# Patient Record
Sex: Female | Born: 1950
Health system: Southern US, Community
[De-identification: ages and names within clinical notes are randomized; demographics above are authoritative.]

## PROBLEM LIST (undated history)

## (undated) DIAGNOSIS — E785 Hyperlipidemia, unspecified: Secondary | ICD-10-CM

## (undated) DIAGNOSIS — K219 Gastro-esophageal reflux disease without esophagitis: Secondary | ICD-10-CM

## (undated) DIAGNOSIS — G7109 Other specified muscular dystrophies: Secondary | ICD-10-CM

## (undated) DIAGNOSIS — G7100 Muscular dystrophy, unspecified: Secondary | ICD-10-CM

## (undated) DIAGNOSIS — S42309A Unspecified fracture of shaft of humerus, unspecified arm, initial encounter for closed fracture: Secondary | ICD-10-CM

## (undated) DIAGNOSIS — E039 Hypothyroidism, unspecified: Secondary | ICD-10-CM

## (undated) HISTORY — PX: COLONOSCOPY: SHX174

## (undated) HISTORY — PX: BELPHAROPTOSIS REPAIR: SHX369

## (undated) HISTORY — DX: Gastro-esophageal reflux disease without esophagitis: K21.9

## (undated) HISTORY — DX: Muscular dystrophy, unspecified: G71.00

## (undated) HISTORY — DX: Other specified muscular dystrophies: G71.09

## (undated) HISTORY — DX: Hypothyroidism, unspecified: E03.9

## (undated) HISTORY — DX: Hyperlipidemia, unspecified: E78.5

## (undated) HISTORY — PX: OTHER SURGICAL HISTORY: SHX169

## (undated) HISTORY — DX: Unspecified fracture of shaft of humerus, unspecified arm, initial encounter for closed fracture: S42.309A

---

## 2001-09-11 ENCOUNTER — Ambulatory Visit (HOSPITAL_COMMUNITY): Admission: RE | Admit: 2001-09-11 | Discharge: 2001-09-11 | Payer: Self-pay | Admitting: Family Medicine

## 2001-09-11 ENCOUNTER — Encounter: Payer: Self-pay | Admitting: Family Medicine

## 2001-09-30 ENCOUNTER — Encounter: Payer: Self-pay | Admitting: Family Medicine

## 2001-09-30 ENCOUNTER — Ambulatory Visit (HOSPITAL_COMMUNITY): Admission: RE | Admit: 2001-09-30 | Discharge: 2001-09-30 | Payer: Self-pay | Admitting: Family Medicine

## 2002-04-28 ENCOUNTER — Ambulatory Visit (HOSPITAL_COMMUNITY): Admission: RE | Admit: 2002-04-28 | Discharge: 2002-04-28 | Payer: Self-pay | Admitting: Internal Medicine

## 2002-04-28 HISTORY — PX: OTHER SURGICAL HISTORY: SHX169

## 2002-06-15 ENCOUNTER — Ambulatory Visit (HOSPITAL_COMMUNITY): Admission: RE | Admit: 2002-06-15 | Discharge: 2002-06-15 | Payer: Self-pay | Admitting: Internal Medicine

## 2004-05-16 ENCOUNTER — Ambulatory Visit (HOSPITAL_COMMUNITY): Admission: RE | Admit: 2004-05-16 | Discharge: 2004-05-16 | Payer: Self-pay | Admitting: Family Medicine

## 2004-05-23 ENCOUNTER — Ambulatory Visit (HOSPITAL_COMMUNITY): Admission: RE | Admit: 2004-05-23 | Discharge: 2004-05-23 | Payer: Self-pay | Admitting: General Surgery

## 2004-05-24 ENCOUNTER — Ambulatory Visit (HOSPITAL_COMMUNITY): Admission: RE | Admit: 2004-05-24 | Discharge: 2004-05-24 | Payer: Self-pay | Admitting: Family Medicine

## 2005-06-11 ENCOUNTER — Ambulatory Visit (HOSPITAL_COMMUNITY): Admission: RE | Admit: 2005-06-11 | Discharge: 2005-06-11 | Payer: Self-pay | Admitting: Podiatry

## 2009-01-28 ENCOUNTER — Inpatient Hospital Stay (HOSPITAL_COMMUNITY): Admission: EM | Admit: 2009-01-28 | Discharge: 2009-01-31 | Payer: Self-pay | Admitting: Emergency Medicine

## 2009-01-29 ENCOUNTER — Ambulatory Visit: Payer: Self-pay | Admitting: Pulmonary Disease

## 2009-01-30 ENCOUNTER — Encounter: Payer: Self-pay | Admitting: Pulmonary Disease

## 2010-11-13 LAB — CBC
HCT: 38.8 % (ref 36.0–46.0)
HCT: 40 % (ref 36.0–46.0)
HCT: 41.1 % (ref 36.0–46.0)
Hemoglobin: 13.8 g/dL (ref 12.0–15.0)
Hemoglobin: 14.1 g/dL (ref 12.0–15.0)
Hemoglobin: 14.1 g/dL (ref 12.0–15.0)
MCHC: 35.3 g/dL (ref 30.0–36.0)
MCHC: 35.5 g/dL (ref 30.0–36.0)
MCHC: 34.4 g/dL (ref 30.0–36.0)
MCV: 89.8 fL (ref 78.0–100.0)
MCV: 90.2 fL (ref 78.0–100.0)
MCV: 92 fL (ref 78.0–100.0)
Platelets: 290 10*3/uL (ref 150–400)
Platelets: 292 10*3/uL (ref 150–400)
Platelets: 303 10*3/uL (ref 150–400)
RBC: 4.32 MIL/uL (ref 3.87–5.11)
RBC: 4.43 MIL/uL (ref 3.87–5.11)
RBC: 4.47 MIL/uL (ref 3.87–5.11)
RDW: 12.7 % (ref 11.5–15.5)
RDW: 12.7 % (ref 11.5–15.5)
RDW: 13 % (ref 11.5–15.5)
WBC: 10.4 10*3/uL (ref 4.0–10.5)
WBC: 6.7 10*3/uL (ref 4.0–10.5)
WBC: 17.4 10*3/uL — ABNORMAL HIGH (ref 4.0–10.5)

## 2010-11-13 LAB — BLOOD GAS, ARTERIAL
Acid-Base Excess: 1.1 mmol/L (ref 0.0–2.0)
Acid-base deficit: 4.2 mmol/L — ABNORMAL HIGH (ref 0.0–2.0)
Acid-base deficit: 2.8 mmol/L — ABNORMAL HIGH (ref 0.0–2.0)
Bicarbonate: 18.4 mEq/L — ABNORMAL LOW (ref 20.0–24.0)
Bicarbonate: 24.4 mEq/L — ABNORMAL HIGH (ref 20.0–24.0)
Bicarbonate: 20.5 mEq/L (ref 20.0–24.0)
Drawn by: 30575
FIO2: 0.35 %
FIO2: 1 %
FIO2: 0.32 %
MECHVT: 450 mL
MECHVT: 450 mL
O2 Saturation: 97.4 %
O2 Saturation: 99.8 %
O2 Saturation: 99 %
Patient temperature: 37
Patient temperature: 37
Patient temperature: 98.6
RATE: 12 resp/min
RATE: 12 resp/min
TCO2: 21.3 mmol/L (ref 0–100)
TCO2: 21.4 mmol/L (ref 0–100)
pCO2 arterial: 23.7 mmHg — ABNORMAL LOW (ref 35.0–45.0)
pCO2 arterial: 33.3 mmHg — ABNORMAL LOW (ref 35.0–45.0)
pCO2 arterial: 29.5 mmHg — ABNORMAL LOW (ref 35.0–45.0)
pH, Arterial: 7.478 — ABNORMAL HIGH (ref 7.350–7.400)
pH, Arterial: 7.503 — ABNORMAL HIGH (ref 7.350–7.400)
pH, Arterial: 7.456 — ABNORMAL HIGH (ref 7.350–7.400)
pO2, Arterial: 416 mmHg — ABNORMAL HIGH (ref 80.0–100.0)
pO2, Arterial: 86.6 mmHg (ref 80.0–100.0)
pO2, Arterial: 132 mmHg — ABNORMAL HIGH (ref 80.0–100.0)

## 2010-11-13 LAB — BASIC METABOLIC PANEL
BUN: 14 mg/dL (ref 6–23)
BUN: 15 mg/dL (ref 6–23)
BUN: 19 mg/dL (ref 6–23)
CO2: 21 mEq/L (ref 19–32)
CO2: 28 mEq/L (ref 19–32)
CO2: 22 mEq/L (ref 19–32)
Calcium: 9.2 mg/dL (ref 8.4–10.5)
Calcium: 9.6 mg/dL (ref 8.4–10.5)
Calcium: 9.3 mg/dL (ref 8.4–10.5)
Chloride: 105 mEq/L (ref 96–112)
Chloride: 106 mEq/L (ref 96–112)
Chloride: 111 mEq/L (ref 96–112)
Creatinine, Ser: 0.67 mg/dL (ref 0.4–1.2)
Creatinine, Ser: 0.87 mg/dL (ref 0.4–1.2)
Creatinine, Ser: 0.82 mg/dL (ref 0.4–1.2)
GFR calc Af Amer: >60 mL/min (ref >60)
GFR calc Af Amer: >60 mL/min (ref >60)
GFR calc Af Amer: >60 mL/min (ref >60)
GFR calc non Af Amer: >60 mL/min (ref >60)
GFR calc non Af Amer: >60 mL/min (ref >60)
GFR calc non Af Amer: >60 mL/min (ref >60)
Glucose, Bld: 102 mg/dL — ABNORMAL HIGH (ref 70–99)
Glucose, Bld: 178 mg/dL — ABNORMAL HIGH (ref 70–99)
Glucose, Bld: 139 mg/dL — ABNORMAL HIGH (ref 70–99)
Potassium: 3.6 mEq/L (ref 3.5–5.1)
Potassium: 3.8 mEq/L (ref 3.5–5.1)
Potassium: 3.7 mEq/L (ref 3.5–5.1)
Sodium: 137 mEq/L (ref 135–145)
Sodium: 138 mEq/L (ref 135–145)
Sodium: 141 mEq/L (ref 135–145)

## 2010-11-13 LAB — DIFFERENTIAL
Basophils Absolute: 0 10*3/uL (ref 0.0–0.1)
Basophils Absolute: 0 10*3/uL (ref 0.0–0.1)
Basophils Relative: 0 % (ref 0–1)
Basophils Relative: 1 % (ref 0–1)
Eosinophils Absolute: 0 10*3/uL (ref 0.0–0.7)
Eosinophils Absolute: 0.4 10*3/uL (ref 0.0–0.7)
Eosinophils Relative: 0 % (ref 0–5)
Eosinophils Relative: 5 % (ref 0–5)
Lymphocytes Relative: 46 % (ref 12–46)
Lymphocytes Relative: 9 % — ABNORMAL LOW (ref 12–46)
Lymphs Abs: 0.9 10*3/uL (ref 0.7–4.0)
Lymphs Abs: 3.1 10*3/uL (ref 0.7–4.0)
Monocytes Absolute: 0.1 10*3/uL (ref 0.1–1.0)
Monocytes Absolute: 0.4 10*3/uL (ref 0.1–1.0)
Monocytes Relative: 1 % — ABNORMAL LOW (ref 3–12)
Monocytes Relative: 7 % (ref 3–12)
Neutro Abs: 2.8 10*3/uL (ref 1.7–7.7)
Neutro Abs: 9.4 10*3/uL — ABNORMAL HIGH (ref 1.7–7.7)
Neutrophils Relative %: 42 % — ABNORMAL LOW (ref 43–77)
Neutrophils Relative %: 90 % — ABNORMAL HIGH (ref 43–77)

## 2010-11-13 LAB — GLUCOSE, CAPILLARY
Glucose-Capillary: 137 mg/dL — ABNORMAL HIGH (ref 70–99)
Glucose-Capillary: 140 mg/dL — ABNORMAL HIGH (ref 70–99)
Glucose-Capillary: 145 mg/dL — ABNORMAL HIGH (ref 70–99)
Glucose-Capillary: 146 mg/dL — ABNORMAL HIGH (ref 70–99)
Glucose-Capillary: 152 mg/dL — ABNORMAL HIGH (ref 70–99)
Glucose-Capillary: 155 mg/dL — ABNORMAL HIGH (ref 70–99)
Glucose-Capillary: 157 mg/dL — ABNORMAL HIGH (ref 70–99)
Glucose-Capillary: 158 mg/dL — ABNORMAL HIGH (ref 70–99)
Glucose-Capillary: 104 mg/dL — ABNORMAL HIGH (ref 70–99)
Glucose-Capillary: 108 mg/dL — ABNORMAL HIGH (ref 70–99)
Glucose-Capillary: 122 mg/dL — ABNORMAL HIGH (ref 70–99)
Glucose-Capillary: 131 mg/dL — ABNORMAL HIGH (ref 70–99)
Glucose-Capillary: 159 mg/dL — ABNORMAL HIGH (ref 70–99)
Glucose-Capillary: 207 mg/dL — ABNORMAL HIGH (ref 70–99)

## 2010-11-13 LAB — CHOLESTEROL, TOTAL: Cholesterol: 178 mg/dL (ref 0–200)

## 2010-11-13 LAB — TRIGLYCERIDES: Triglycerides: 337 mg/dL — ABNORMAL HIGH (ref <150)
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
## 2010-12-19 NOTE — Discharge Summary (Signed)
[name], [name]               ACCOUNT NO.:  [id number]   MEDICAL RECORD NO.:  [id number]          PATIENT TYPE:  INP   LOCATION:  5522                         FACILITY:  [facility]   PHYSICIAN:  Casimiro Needle B. Sherene Sires, MD, FCCPDATE OF BIRTH:  [birth year]   DATE OF ADMISSION:  01/29/2009  DATE OF DISCHARGE:  01/31/2009                               DISCHARGE SUMMARY   DISCHARGE DIAGNOSES:  1. Acute respiratory failure, secondary to angioedema.  2. Anaphylaxis.  3. Hypothyroidism.  4. Hyperglycemia.   HISTORY OF PRESENT ILLNESS:  [name] is a 60 year old female who  recently had bunion surgery on her right foot and attempted to take some  pain medication, which was less powerful than her Percocet and took  Motrin the morning of June 25, after which she acutely developed tongue  and airway swelling.  She was intubated at [facility] and  transferred the evening of June 26, to [facility] for the  purpose of ENT evaluation regarding extubation given her angioedema.  Critical Care Medicine was asked to admit the patient from [facility] on June 26 and she was admitted directly to [facility]  intubated from [facility].   LABORATORY DATA:  Most recent laboratory data, basic metabolic panel,  sodium 141, potassium 3.7, glucose 139, BUN 19, and creatinine 0.82.  CBC, white blood cell 17.4, hemoglobin 14.1, hematocrit  41.1, platelets  303.  Arterial blood gas from June 26, pH 7.45, PCO2 29.5, PO2 132, and  bicarb 20.5.   RADIOLOGY DATA:  Most recent chest x-ray from June 27, shows decreased  left basilar atelectasis and stable chronic bronchitic changes.   MICRO DATA:  No micro data is available at this time.   HOSPITAL COURSE BY DISCHARGE DIAGNOSES:  1. Acute respiratory failure secondary to angioedema.  The patient      developed respiratory failure secondary to angioedema from the use      of NSAIDs.  She was intubated at [facility] on June  25,      transferred to [facility] on June 26, for ENT evaluation      of extubation.  The patient was intubated from June 25 though June      27.  She was successfully extubated on the 27th.  She has had no      further airway issues.  She was treated with steroids with Pepcid      and Benadryl.  The patient actually self-extubated on June 27 prior      to evaluation by ENT, but was stable and has tolerated extubation.      She is now wake and alert.  No airway problems.  No wheezing or      stridor.  Pepcid and Benadryl will be discontinued and the patient      will continued on prednisone taper.  She also has an allergy to      Prilosec and to some mouth care agents.  She has been recommended      to only use sodium bicarb based solution for mouth care  and she      will completely avoid all NSAIDs.  2. Anaphylaxis.  Again, this was due to an obvious NSAID allergy.  The      patient has been instructed to use no NSAIDs and has been      instructed not to use Prilosec or any mouth care agents other than      sodium bicarbonate based solutions for mouth care as she has an      allergy to these as well.  Anaphylaxis is completely resolved.  The      patient's vital signs are stable.  Airway is no longer an issue.      She is awake and alert.  She will follow up with her primary care      physician.  3. Hypothyroidism.  The patient was continued on her home dose of      Synthroid.  This is an ongoing problem.  She will follow up with      her regular physician.  4. Hyperglycemia.  The patient was most likely hyperglycemic due to      steroids.  Steroids are currently being tapered.  She was treated      with sliding scale insulin while she was an inpatient.  Blood      sugars are trending back to normal.  The last blood sugar was 139.      Again, she will followup with her primary care physician, but this      should resolve as she tapers off her prednisone.   DISCHARGE  MEDICATIONS:  1. Crestor 40 mg p.o. daily.  2. Cymbalta 60 mg p.o. daily.  3. Levoxyl 88 mcg p.o. daily.  4. Nexium 40 mg p.o. daily.  5. Percocet 5/325 one tablet p.o. q.4 h. as needed.  6. Prednisone 10 mg, this will be 3 tablets p.o. daily x2 days, and 2      tablets p.o. daily x2 days, then 1 tablet p.o. daily x2 days, then      stop.  This is a new medication for the patient and a prescription      was given.  7. Benadryl 25 mg p.o. q.12 h. as needed.   FOLLOWUP:  The patient is to follow up with her primary care physician,  Dr. Phillips Odor.  The patient is also to follow up with ENT who she  apparently has a previous relationship with at [facility].  She is to make  both of these appointments on her own in the next 1 to 2 weeks.  She is  to call our office sooner as needed.   DISPOSITION:  The patient has met maximum benefits from her inpatient  hospitalization.  Her respiratory failure and angioedema have resolved  and was clearly related to anaphylactic reaction to NSAID.  She has been  instructed not to take any NSAIDs of any kind.  The patient will follow  up on an outpatient basis with her primary care physician, Dr. Phillips Odor.  She will be discharged on her regular home medications with the addition  of prednisone taper.  She can take Benadryl as needed.      Dirk Dress, NP      Charlaine Dalton. Sherene Sires, MD, [facility]  Electronically Signed    KW/MEDQ  D:  01/31/2009  T:  01/31/2009  Job:  [id number]   cc:   Charlaine Dalton. Sherene Sires, MD, FCCP  Corrie Mckusick, M.D.
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
## 2010-12-22 NOTE — Op Note (Signed)
   NAME:  [name], [name]                         ACCOUNT NO.:  [id number]   MEDICAL RECORD NO.:  [id number]                   PATIENT TYPE:  AMB   LOCATION:  DAY                                  FACILITY:  [facility]   PHYSICIAN:  Gerrit Friends. Rourk, M.D.               DATE OF BIRTH:  26-Jun-1951   DATE OF PROCEDURE:  04/29/2002  DATE OF DISCHARGE:  04/28/2002                                 OPERATIVE REPORT   PROCEDURE:  Ambulatory esophageal pH report.   INDICATIONS:  Please see esophageal manometry report.   PRIOR STUDIES:  Please see esophageal manometry report.   METHOD:  The pH electrodes were placed 20 and 5 cm above the proximal border  of the manometrically determined lower esophageal sphincter (LES).  These  electrodes were defined as channels 1 and 2, respectively.  The data was  converted using the [employer], version 2.30. A reflux  episode was defined as a drop in pH below 4.0.  The procedure was reviewed  with the patient prior to performing it.   FINDINGS:  Proximal border of the LES (location from nares): 42 cm.  Study duration: 24 hours.  Channel 2 analysis  Total time pH below 4.0 (min): 14 minutes.  Fraction of total time pH below 4.0: 1.0.  DeMeester score (DeMeester normals less than 14.7 (95th percentile): 5.3.   IMPRESSION:  This study was performed on proton pump inhibitor therapy.  The  patient reported taking 1 dose daily; however, according to noted provided  by Dr. Lucky Cowboy she was on b.i.d. therapy.  Based on this 24-hour study  there was physiological reflux.  She appears to have adequate control of  acid reflux on PTI therapy.  However, there is no baseline pH study for  comparison.  Please note, the patient did not report any symptoms during  this 24-hour period study.  Therefore, correlation with symptoms is  unavailable.   Based on EM and pH study, it is difficult to correlate patient's symptoms  with these findings.  However,  please note if patient had significant reflux  esophagitis with laryngopharyngeal reflux it could take high dose PPI  therapy for several months to have complete resolution of symptoms.  If  patient's symptoms persist should consider modified barium swallow study,  and/or EGD.  AM and pH findings reviewed and discussed with Dr. Jena Gauss.     Tana Coast, P.A.                        Gerrit Friends. Rourk, M.D.    LL/MEDQ  D:  05/04/2002  T:  05/05/2002  Job:  [id number]   cc:   Lucky Cowboy, MD

## 2010-12-22 NOTE — Op Note (Signed)
NAMETIFFIANY, BEADLES               ACCOUNT NO.:  [id number]   MEDICAL RECORD NO.:  [id number]           PATIENT TYPE:  AMB   LOCATION:                                FACILITY:  [facility]   PHYSICIAN:  Oley Balm. Pricilla Holm, D.P.M.DATE OF BIRTH:  Oct 21, 1950   DATE OF PROCEDURE:  06/11/2005  DATE OF DISCHARGE:                                 OPERATIVE REPORT   SURGEON:  Oley Balm. Pricilla Holm, D.P.M.   ANESTHESIA:  Monitored anesthesia care.   PREOPERATIVE DIAGNOSES:  1.  Hallux valgus deformity, left foot.  2.  Hyperostosis, hallux.   POSTOPERATIVE DIAGNOSES:  1.  Hallux valgus deformity, left foot.  2.  Hyperostosis, hallux.   OPERATION/PROCEDURE:  Austin bunionectomy, left foot with arthroplasty,  hallux.   INDICATIONS:  Long-standing history of pain unrelieved by conservative care.   DESCRIPTION OF PROCEDURE:  The patient was brought to the operating room and  placed on the operating table in the supine position.  The patient's lower  left foot and leg were then prepped and draped in the usual aseptic manner.  Then with an ankle tourniquet in place, and well padded to prevent contusion  and elevated to 250 mmHg, after exsanguination of the left foot, the  following surgical procedure was then performed under monitored anesthesia  care.   Austin bunionectomy, left foot, with arthroplasty left hallux, double K-wire  fixation:  Attention was directed towards the medial aspect of the first MTP  where a curvilinear incision was made and incision widened and deepened  using sharp and blunt dissection, being sure to identify and retract all  vital structures.  Capsular incision was then made and the head of the  metatarsal freed from surrounding soft tissue attachments dorsally and  medially.  Utilizing a Zimmer oscillating saw, the medial aspect of the  first metatarsal head was resected.  Utilizing a guide wire, an Austin-type  osteotomy was made with the apex distal and the base proximal.  A  lateral  fibular sesamoid release was performed and the capsule fragment was then  displaced laterally, impacted on the first metatarsal shaft, and fixated  with two 0.045 K-wires.  After fixation, it was noted that the osteotomy  site was stable.  The remaining protruding aspect of the metatarsal head was  rasped smooth and resected.  Then a capsulorrhaphy was performed, basically  taking a V-wedge capsule out of the medial capsule to keep the toe in rectus  position.  The capsule was then approximated utilizing continuous running  sutures of 3-0 Dexon.  Skin was approximated utilizing running subcuticular  suture of 4-0 Dexon.   All surgical sites were then infiltrated with approximately 1/8 mL of  dexamethasone phosphate and Mild compressive bandage consisting of Betadine-  soaked Adaptics and 4 x 4 sterile Kling was applied.  The patient tolerated  the procedure well and left the operating room in apparent good condition  with good vital signs to the recovery room.      Oley Balm Pricilla Holm, D.P.M.  Electronically Signed     DBT/MEDQ  D:  06/11/2005  T:  06/11/2005  Job:  [id number]

## 2010-12-22 NOTE — Op Note (Signed)
NAME:  [name], [name]                         ACCOUNT NO.:  [id number]   MEDICAL RECORD NO.:  [id number]                   PATIENT TYPE:  AMB   LOCATION:  DAY                                  FACILITY:  [facility]   PHYSICIAN:  Gerrit Friends. Rourk, M.D.               DATE OF BIRTH:   DATE OF PROCEDURE:  04/28/2002  DATE OF DISCHARGE:  04/28/2002                                 OPERATIVE REPORT   PROCEDURE:  Esophageal mammometer.   INDICATIONS:  Dysphagia with laryngopharyngeal reflux.   PRIOR STUDIES:  04/13/02 transnasal fiberoptic laryngoscopy by Dr. Lucky Cowboy revealed a moderate amount of vocal cord edema with blunting of  ventricles and moderate interarytenoid edema.  02/03 barium pill esophagogram revealed normal appearance of the cervical  and thoracic esophagus, mild selective pooling, laryngeal penetration of a  significant amount of contrast during swallowing a portion of which  spontaneously cleared and a portion of which traveled dependently through  the false cords.  Questionable filling defect left lateral aspect of the  pharynx at the level of the mid and superior portions of the left pyriform  sinus.   METHOD:  The esophageal manometry study was performed with [employer]ics Medial  Gastrosoft Upper Gastrointestinal Polygram, version 6.40, using an Arndorfer  infusion system at 0.5 cc/min.  The lower esophageal sphincter (LES) was  identified in four ports with the standard station pull through technique.  For esophageal body pressures, the tip of the catheter was placed 3 cm above  the proximal aspect of the LES.  Ten wet swallows were taken with the ports  5 cm apart in the esophageal body.  This procedure was reviewed with the  patient prior to performing it.   FINDINGS:  Esophageal body:         13 cm       8 cm        3 cm        Mean  (distal 2 ports)  Amplitude (mmHg)  (38-102)    (49-131)    (64-154)    (59-139)  Patient                 11.1        32.4        45.7         39  Duration (seconds)            (3-5)       (3-5)       (2.5-3.5)   (3.5)  Patient                 4.3         4.0         4.8         4.4  Contractions (with wet swallows) 70% low amplitude, 10% simultaneous.  Otherwise good peristalsis seen.  Lower esophageal sphincter (LES): Located  at 45-42 cm (from nares)  Resting pressure: 6.1 mmHg.  Relaxation:  Adequate.   IMPRESSION:  1. Nonspecific motility disorder.  There was low amplitude seen in 70% of     the swallows.  There was also low resting pressure with adequate LES     relaxation.  This could be seen in reflux esophagitis.  The findings do     not necessarily explain the patient's symptoms; however.  2. It is unclear with baseline information provided how long the patient has     been on proton pump inhibitor.  Low     amplitude and low resting pressures can be seen in reflux esophagitis;     however, it remains unclear how long it takes for the above findings to     reverse after adequate control of acid reflux.   RECOMMENDATIONS:  Please see esophageal pH probe study report.     Tana Coast, P.A.                        Gerrit Friends. Rourk, M.D.    LL/MEDQ  D:  05/04/2002  T:  05/04/2002  Job:  [id number]   cc:   Dorna Leitz, M.D.
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
## 2010-12-22 NOTE — H&P (Signed)
[name], CARLUCCI NO.:  [id number]   MEDICAL RECORD NO.:  [id number]          PATIENT TYPE:  OUT   LOCATION:  RAD                           FACILITY:  [facility]   PHYSICIAN:  Dalia Heading, M.D.  DATE OF BIRTH:  08-Aug-1950   DATE OF ADMISSION:  05/16/2004  DATE OF DISCHARGE:  10/11/2005LH                                HISTORY & PHYSICAL   CHIEF COMPLAINT:  Need for screening colonoscopy.   HISTORY OF PRESENT ILLNESS:  The patient is a 60 year old white female who  is referred for endoscopic evaluation.  She needs a colonoscopy for  screening purposes.  No abdominal pain, weight loss, nausea, vomiting,  diarrhea, constipation, melena, or hematochezia have been noted.  There is  no history, has never had a colonoscopy.   PAST MEDICAL HISTORY:  Hypothyroidism.   PAST SURGICAL HISTORY:  Unremarkable.   CURRENT MEDICATIONS:  Levoxyl, Vytorin.   ALLERGIES:  No known drug allergies.   REVIEW OF SYSTEMS:  The patient gives a history of muscular dysfunction  secondary to muscular dystrophy.  This primarily affects her neck.  No other  abnormalities are noted.   PHYSICAL EXAMINATION:  GENERAL:  The patient is a well-developed, well-  nourished white female in no acute distress.  VITAL SIGNS:  She is afebrile and vital signs are stable.  CHEST:  Lungs clear to auscultation with equal breath sounds bilaterally.  CARDIAC:  Regular rate and rhythm without S3, S4, or murmurs.  ABDOMEN:  Soft, nontender, nondistended.  No hepatosplenomegaly or masses  are noted.  RECTAL:  Deferred to the procedure.   IMPRESSION:  Need for screening colonoscopy.   PLAN:  The patient is scheduled for a colonoscopy on May 23, 2004.  The  risks and benefits of the procedure, including bleeding and perforation,  were fully explained to the patient, who gave informed consent.     Mark   MAJ/MEDQ  D:  05/18/2004  T:  05/18/2004  Job:  [id number]   cc:   Corrie Mckusick, M.D.  Fax:  [phone]   [facility]  Fax: [phone]

## 2010-12-22 NOTE — Op Note (Signed)
   NAME:  [name], [name]                         ACCOUNT NO.:  [id number]   MEDICAL RECORD NO.:  [id number]                   PATIENT TYPE:  AMB   LOCATION:  DAY                                  FACILITY:  [facility]   PHYSICIAN:  R. Roetta Sessions, M.D.              DATE OF BIRTH:  [birth year]   DATE OF PROCEDURE:  06/15/2002  DATE OF DISCHARGE:  06/15/2002                                 OPERATIVE REPORT   ADDENDUM:   PROCEDURE:  Esophageal manometry.   INDICATION FOR PROCEDURE:  The patient is a very pleasant lady referred over  by Lucky Cowboy, M.D., for evaluation of difficulty initiating swallowing.  She underwent esophageal manometry on 04/28/02, which revealed a nonspecific  esophageal motility disorder.  The UES was nonspecifically studied.   An ambulatory esophageal pH probe study revealed equivocal findings.   I was asked by Dr. Gerilyn Pilgrim to bring this patient back and study her UES.  This  was done at no charge to the patient.  This procedure was discussed with the  patient.   METHOD:  Please note the manometry catheter was placed through the nares,  and the LES was determined.  The proximal port was pulled back up to the  level of the UES, and wet swallows were performed.  UES function was noted  to be repeatedly normal with residual pressure (pressure had been negative  with relaxation) to negative territory.  The tracings were reviewed with  myself and Lionel December, M.D.   ASSESSMENT:  Upper esophageal sphincter relaxation within normal limits.  Unable to make a diagnosis of upper esophageal sphincter achalasia.    DISCUSSION:  Although the patient's symptoms are compelling manometrically,  we cannot make a diagnosis.  I have discussed the findings with Dr. Gerilyn Pilgrim.  She tells me she will likely send the patient to [facility] for further evaluation.                                                Jonathon Bellows, M.D.    RMR/MEDQ  D:  07/19/2002   T:  07/20/2002  Job:  [id number]   cc:   Lucky Cowboy, M.D.  [address] [place]  [place] [address]  Fax: [phone]   Mila Homer. Sudie Bailey, M.D.  [address] [place], [place] [address]  Fax: [phone]

## 2011-09-17 DIAGNOSIS — G7109 Other specified muscular dystrophies: Secondary | ICD-10-CM | POA: Insufficient documentation

## 2012-04-11 ENCOUNTER — Other Ambulatory Visit (HOSPITAL_COMMUNITY): Payer: Self-pay | Admitting: Family Medicine

## 2012-04-11 DIAGNOSIS — E039 Hypothyroidism, unspecified: Secondary | ICD-10-CM | POA: Diagnosis not present

## 2012-04-11 DIAGNOSIS — E785 Hyperlipidemia, unspecified: Secondary | ICD-10-CM | POA: Diagnosis not present

## 2012-04-11 DIAGNOSIS — Z0000 Encounter for general adult medical examination without abnormal findings: Secondary | ICD-10-CM | POA: Diagnosis not present

## 2012-04-11 DIAGNOSIS — IMO0002 Reserved for concepts with insufficient information to code with codable children: Secondary | ICD-10-CM | POA: Diagnosis not present

## 2012-04-11 DIAGNOSIS — Z01419 Encounter for gynecological examination (general) (routine) without abnormal findings: Secondary | ICD-10-CM

## 2012-04-16 ENCOUNTER — Ambulatory Visit (HOSPITAL_COMMUNITY)
Admission: RE | Admit: 2012-04-16 | Discharge: 2012-04-16 | Disposition: A | Discharge status: Home / Self Care | Payer: Medicare Other | Source: Ambulatory Visit | Attending: Family Medicine | Admitting: Family Medicine

## 2012-04-16 DIAGNOSIS — M949 Disorder of cartilage, unspecified: Secondary | ICD-10-CM | POA: Insufficient documentation

## 2012-04-16 DIAGNOSIS — Z13820 Encounter for screening for osteoporosis: Secondary | ICD-10-CM | POA: Diagnosis not present

## 2012-04-16 DIAGNOSIS — M899 Disorder of bone, unspecified: Secondary | ICD-10-CM | POA: Insufficient documentation

## 2012-04-16 DIAGNOSIS — Z780 Asymptomatic menopausal state: Secondary | ICD-10-CM | POA: Diagnosis not present

## 2012-04-16 DIAGNOSIS — Z01419 Encounter for gynecological examination (general) (routine) without abnormal findings: Secondary | ICD-10-CM

## 2012-05-12 ENCOUNTER — Telehealth: Payer: Self-pay

## 2012-05-12 NOTE — Telephone Encounter (Signed)
LMOM to call.

## 2012-05-16 ENCOUNTER — Telehealth: Payer: Self-pay | Admitting: Internal Medicine

## 2012-05-16 NOTE — Telephone Encounter (Signed)
Pt was calling to be triaged for a tcs. Please call her back on Monday at [phone]

## 2012-05-19 ENCOUNTER — Telehealth: Payer: Self-pay

## 2012-05-19 NOTE — Telephone Encounter (Signed)
See separate phone note.

## 2012-05-19 NOTE — Telephone Encounter (Signed)
Called pt to triage for colonoscopy. She has muscular dystrophy and sometimes difficult swallowing. Ov scheduled for 06/23/2012 @ 8:30 AM with Gerrit Halls, NP. ( Pt requested a nov appt) Sending a letter to PCP.

## 2012-05-19 NOTE — Telephone Encounter (Signed)
See separate triage note.  

## 2012-06-20 ENCOUNTER — Encounter: Payer: Self-pay | Admitting: Internal Medicine

## 2012-06-23 ENCOUNTER — Encounter: Payer: Self-pay | Admitting: Gastroenterology

## 2012-06-23 ENCOUNTER — Ambulatory Visit (INDEPENDENT_AMBULATORY_CARE_PROVIDER_SITE_OTHER): Payer: Medicare Other | Admitting: Gastroenterology

## 2012-06-23 VITALS — BP 110/68 | HR 60 | Temp 97.4°F | Ht 62.0 in | Wt 115.2 lb

## 2012-06-23 DIAGNOSIS — R1310 Dysphagia, unspecified: Secondary | ICD-10-CM

## 2012-06-23 DIAGNOSIS — Z1211 Encounter for screening for malignant neoplasm of colon: Secondary | ICD-10-CM | POA: Diagnosis not present

## 2012-06-23 MED ORDER — PEG 3350-KCL-NA BICARB-NACL 420 G PO SOLR: 4000.0000 mL | ORAL | Status: DC

## 2012-06-23 NOTE — Patient Instructions (Addendum)
We have set you up for a colonoscopy with Dr. Jena Gauss in the near future.  Have a wonderful Thanksgiving!
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
## 2012-06-24 ENCOUNTER — Encounter: Payer: Self-pay | Admitting: Gastroenterology

## 2012-06-24 DIAGNOSIS — R1310 Dysphagia, unspecified: Secondary | ICD-10-CM | POA: Insufficient documentation

## 2012-06-24 DIAGNOSIS — Z1211 Encounter for screening for malignant neoplasm of colon: Secondary | ICD-10-CM | POA: Insufficient documentation

## 2012-06-24 NOTE — Progress Notes (Signed)
Faxed to PCP

## 2012-06-24 NOTE — Assessment & Plan Note (Signed)
Likely secondary to oculopharyngeal muscular dystrophy. Pt does not desire upper GI evaluation. Appetite good, wt stable. No other concerning signs or symptoms. Pt notes good control of reflux with dietary modification and Nexium. Retrieve op notes from [place] regarding surgery performed on muscle in throat. (for our review).

## 2012-06-24 NOTE — Assessment & Plan Note (Signed)
61 year old female with last TCS in remote past; pt is unsure if performed by [place] or elsewhere. I have attempted to retrieve records if available from [facility]. Regardless, she is due for routine screening colonoscopy. She has no lower GI symptoms currently.   Proceed with TCS with Dr. Jena Gauss in near future: the risks, benefits, and alternatives have been discussed with the patient in detail. The patient states understanding and desires to proceed.

## 2012-06-26 ENCOUNTER — Encounter (HOSPITAL_COMMUNITY): Payer: Self-pay | Admitting: Gastroenterology

## 2012-06-26 NOTE — Progress Notes (Signed)
I have cancelled procedure and I have LMOM for patient to return my call to let her know the situation

## 2012-06-26 NOTE — Progress Notes (Signed)
Received reports from [facility].  Pt actually had a colonoscopy in Oct 2005 with Dr. Lovell Sheehan. This was normal.  No FH of colon cancer. I don't believe there is a hx of polyps. She does not need a colonoscopy until 2015. She is not having any problems currently.  Please let pt know, and she can return to see [place] in 2015 or sooner if needed. Can cancel colonoscopy upcoming with Dr. Jena Gauss.

## 2012-07-11 ENCOUNTER — Encounter (HOSPITAL_COMMUNITY): Admission: RE | Payer: Self-pay | Source: Ambulatory Visit

## 2012-07-11 ENCOUNTER — Ambulatory Visit (HOSPITAL_COMMUNITY): Admission: RE | Admit: 2012-07-11 | Payer: Medicare Other | Source: Ambulatory Visit | Admitting: Internal Medicine

## 2012-07-11 SURGERY — COLONOSCOPY
Anesthesia: Moderate Sedation

## 2013-07-29 DIAGNOSIS — E039 Hypothyroidism, unspecified: Secondary | ICD-10-CM | POA: Diagnosis not present

## 2013-07-29 DIAGNOSIS — IMO0002 Reserved for concepts with insufficient information to code with codable children: Secondary | ICD-10-CM | POA: Diagnosis not present

## 2013-07-29 DIAGNOSIS — E785 Hyperlipidemia, unspecified: Secondary | ICD-10-CM | POA: Diagnosis not present

## 2014-04-06 ENCOUNTER — Other Ambulatory Visit (HOSPITAL_COMMUNITY): Payer: Self-pay | Admitting: Physician Assistant

## 2014-04-06 DIAGNOSIS — Z124 Encounter for screening for malignant neoplasm of cervix: Secondary | ICD-10-CM | POA: Diagnosis not present

## 2014-04-06 DIAGNOSIS — Z0000 Encounter for general adult medical examination without abnormal findings: Secondary | ICD-10-CM | POA: Diagnosis not present

## 2014-04-06 DIAGNOSIS — N879 Dysplasia of cervix uteri, unspecified: Secondary | ICD-10-CM | POA: Diagnosis not present

## 2014-04-06 DIAGNOSIS — IMO0002 Reserved for concepts with insufficient information to code with codable children: Secondary | ICD-10-CM | POA: Diagnosis not present

## 2014-04-06 DIAGNOSIS — Z01419 Encounter for gynecological examination (general) (routine) without abnormal findings: Secondary | ICD-10-CM | POA: Diagnosis not present

## 2014-04-06 DIAGNOSIS — Z79899 Other long term (current) drug therapy: Secondary | ICD-10-CM | POA: Diagnosis not present

## 2014-04-06 DIAGNOSIS — Z1231 Encounter for screening mammogram for malignant neoplasm of breast: Secondary | ICD-10-CM

## 2014-04-07 ENCOUNTER — Other Ambulatory Visit (HOSPITAL_COMMUNITY): Payer: Self-pay | Admitting: Physician Assistant

## 2014-04-07 DIAGNOSIS — N879 Dysplasia of cervix uteri, unspecified: Secondary | ICD-10-CM

## 2014-04-09 ENCOUNTER — Ambulatory Visit (HOSPITAL_COMMUNITY)
Admission: RE | Admit: 2014-04-09 | Discharge: 2014-04-09 | Disposition: A | Discharge status: Home / Self Care | Payer: Medicare Other | Source: Ambulatory Visit | Attending: Physician Assistant | Admitting: Physician Assistant

## 2014-04-09 DIAGNOSIS — N879 Dysplasia of cervix uteri, unspecified: Secondary | ICD-10-CM | POA: Insufficient documentation

## 2014-04-09 DIAGNOSIS — Z1231 Encounter for screening mammogram for malignant neoplasm of breast: Secondary | ICD-10-CM

## 2014-04-09 DIAGNOSIS — N83209 Unspecified ovarian cyst, unspecified side: Secondary | ICD-10-CM | POA: Diagnosis not present

## 2014-06-15 DIAGNOSIS — G710 Muscular dystrophy: Secondary | ICD-10-CM | POA: Diagnosis not present

## 2014-06-15 DIAGNOSIS — R1312 Dysphagia, oropharyngeal phase: Secondary | ICD-10-CM | POA: Diagnosis not present

## 2014-06-16 ENCOUNTER — Other Ambulatory Visit: Payer: Self-pay | Admitting: Otolaryngology

## 2014-06-16 DIAGNOSIS — G7100 Muscular dystrophy, unspecified: Secondary | ICD-10-CM

## 2014-06-16 DIAGNOSIS — R1312 Dysphagia, oropharyngeal phase: Secondary | ICD-10-CM

## 2014-06-18 ENCOUNTER — Ambulatory Visit
Admission: RE | Admit: 2014-06-18 | Discharge: 2014-06-18 | Disposition: A | Discharge status: Home / Self Care | Payer: Medicare Other | Source: Ambulatory Visit | Attending: Otolaryngology | Admitting: Otolaryngology

## 2014-06-18 DIAGNOSIS — G7100 Muscular dystrophy, unspecified: Secondary | ICD-10-CM

## 2014-06-18 DIAGNOSIS — R1312 Dysphagia, oropharyngeal phase: Secondary | ICD-10-CM | POA: Diagnosis not present

## 2014-07-21 DIAGNOSIS — G710 Muscular dystrophy: Secondary | ICD-10-CM | POA: Diagnosis not present

## 2014-07-21 DIAGNOSIS — Z87891 Personal history of nicotine dependence: Secondary | ICD-10-CM | POA: Diagnosis not present

## 2014-07-21 DIAGNOSIS — H02403 Unspecified ptosis of bilateral eyelids: Secondary | ICD-10-CM | POA: Diagnosis not present

## 2014-07-21 DIAGNOSIS — H02423 Myogenic ptosis of bilateral eyelids: Secondary | ICD-10-CM | POA: Diagnosis not present

## 2014-07-21 DIAGNOSIS — H53461 Homonymous bilateral field defects, right side: Secondary | ICD-10-CM | POA: Diagnosis not present

## 2014-08-30 DIAGNOSIS — E039 Hypothyroidism, unspecified: Secondary | ICD-10-CM | POA: Insufficient documentation

## 2014-08-30 DIAGNOSIS — K219 Gastro-esophageal reflux disease without esophagitis: Secondary | ICD-10-CM | POA: Insufficient documentation

## 2014-08-31 DIAGNOSIS — G710 Muscular dystrophy: Secondary | ICD-10-CM | POA: Diagnosis not present

## 2014-08-31 DIAGNOSIS — Z01818 Encounter for other preprocedural examination: Secondary | ICD-10-CM | POA: Diagnosis not present

## 2014-09-09 DIAGNOSIS — E785 Hyperlipidemia, unspecified: Secondary | ICD-10-CM | POA: Diagnosis not present

## 2014-09-09 DIAGNOSIS — E039 Hypothyroidism, unspecified: Secondary | ICD-10-CM | POA: Diagnosis not present

## 2014-09-09 DIAGNOSIS — H02403 Unspecified ptosis of bilateral eyelids: Secondary | ICD-10-CM | POA: Diagnosis not present

## 2014-09-09 DIAGNOSIS — Z87891 Personal history of nicotine dependence: Secondary | ICD-10-CM | POA: Diagnosis not present

## 2014-09-09 DIAGNOSIS — H02423 Myogenic ptosis of bilateral eyelids: Secondary | ICD-10-CM | POA: Diagnosis not present

## 2014-09-09 DIAGNOSIS — K219 Gastro-esophageal reflux disease without esophagitis: Secondary | ICD-10-CM | POA: Diagnosis not present

## 2014-09-09 DIAGNOSIS — G710 Muscular dystrophy: Secondary | ICD-10-CM | POA: Diagnosis not present

## 2014-11-16 DIAGNOSIS — R1312 Dysphagia, oropharyngeal phase: Secondary | ICD-10-CM | POA: Diagnosis not present

## 2014-11-16 DIAGNOSIS — G710 Muscular dystrophy: Secondary | ICD-10-CM | POA: Diagnosis not present

## 2014-11-16 DIAGNOSIS — Z8709 Personal history of other diseases of the respiratory system: Secondary | ICD-10-CM | POA: Diagnosis not present

## 2014-11-16 DIAGNOSIS — Z87891 Personal history of nicotine dependence: Secondary | ICD-10-CM | POA: Diagnosis not present

## 2014-11-17 DIAGNOSIS — R1313 Dysphagia, pharyngeal phase: Secondary | ICD-10-CM | POA: Insufficient documentation

## 2014-12-02 DIAGNOSIS — Z5189 Encounter for other specified aftercare: Secondary | ICD-10-CM | POA: Diagnosis not present

## 2014-12-02 DIAGNOSIS — R490 Dysphonia: Secondary | ICD-10-CM | POA: Diagnosis not present

## 2014-12-02 DIAGNOSIS — R1310 Dysphagia, unspecified: Secondary | ICD-10-CM | POA: Diagnosis not present

## 2014-12-02 DIAGNOSIS — G710 Muscular dystrophy: Secondary | ICD-10-CM | POA: Diagnosis not present

## 2014-12-24 DIAGNOSIS — R1313 Dysphagia, pharyngeal phase: Secondary | ICD-10-CM | POA: Diagnosis not present

## 2014-12-31 DIAGNOSIS — R1313 Dysphagia, pharyngeal phase: Secondary | ICD-10-CM | POA: Diagnosis not present

## 2015-01-07 DIAGNOSIS — R1313 Dysphagia, pharyngeal phase: Secondary | ICD-10-CM | POA: Diagnosis not present

## 2015-01-07 DIAGNOSIS — Z5189 Encounter for other specified aftercare: Secondary | ICD-10-CM | POA: Diagnosis not present

## 2015-01-20 DIAGNOSIS — E782 Mixed hyperlipidemia: Secondary | ICD-10-CM | POA: Diagnosis not present

## 2015-01-20 DIAGNOSIS — E039 Hypothyroidism, unspecified: Secondary | ICD-10-CM | POA: Diagnosis not present

## 2015-01-20 DIAGNOSIS — G710 Muscular dystrophy: Secondary | ICD-10-CM | POA: Diagnosis not present

## 2015-01-20 DIAGNOSIS — Z6820 Body mass index (BMI) 20.0-20.9, adult: Secondary | ICD-10-CM | POA: Diagnosis not present

## 2015-01-21 DIAGNOSIS — Z5189 Encounter for other specified aftercare: Secondary | ICD-10-CM | POA: Diagnosis not present

## 2015-01-21 DIAGNOSIS — R1313 Dysphagia, pharyngeal phase: Secondary | ICD-10-CM | POA: Diagnosis not present

## 2015-03-10 DIAGNOSIS — D471 Chronic myeloproliferative disease: Secondary | ICD-10-CM | POA: Diagnosis not present

## 2015-03-10 DIAGNOSIS — G710 Muscular dystrophy: Secondary | ICD-10-CM | POA: Diagnosis not present

## 2015-03-10 DIAGNOSIS — R1312 Dysphagia, oropharyngeal phase: Secondary | ICD-10-CM | POA: Diagnosis not present

## 2015-03-10 DIAGNOSIS — R1319 Other dysphagia: Secondary | ICD-10-CM | POA: Diagnosis not present

## 2015-03-10 DIAGNOSIS — R471 Dysarthria and anarthria: Secondary | ICD-10-CM | POA: Diagnosis not present

## 2015-04-20 DIAGNOSIS — F4325 Adjustment disorder with mixed disturbance of emotions and conduct: Secondary | ICD-10-CM | POA: Diagnosis not present

## 2015-05-17 DIAGNOSIS — E039 Hypothyroidism, unspecified: Secondary | ICD-10-CM | POA: Diagnosis not present

## 2015-05-17 DIAGNOSIS — K219 Gastro-esophageal reflux disease without esophagitis: Secondary | ICD-10-CM | POA: Diagnosis not present

## 2015-05-17 DIAGNOSIS — Z1389 Encounter for screening for other disorder: Secondary | ICD-10-CM | POA: Diagnosis not present

## 2015-05-17 DIAGNOSIS — Z23 Encounter for immunization: Secondary | ICD-10-CM | POA: Diagnosis not present

## 2015-05-17 DIAGNOSIS — F329 Major depressive disorder, single episode, unspecified: Secondary | ICD-10-CM | POA: Diagnosis not present

## 2015-05-17 DIAGNOSIS — E782 Mixed hyperlipidemia: Secondary | ICD-10-CM | POA: Diagnosis not present

## 2015-05-17 DIAGNOSIS — Z0000 Encounter for general adult medical examination without abnormal findings: Secondary | ICD-10-CM | POA: Diagnosis not present

## 2015-07-08 DIAGNOSIS — Z87891 Personal history of nicotine dependence: Secondary | ICD-10-CM | POA: Diagnosis not present

## 2015-07-08 DIAGNOSIS — R413 Other amnesia: Secondary | ICD-10-CM | POA: Diagnosis not present

## 2015-07-25 ENCOUNTER — Other Ambulatory Visit (HOSPITAL_COMMUNITY): Payer: Self-pay | Admitting: Family Medicine

## 2015-07-25 DIAGNOSIS — Z1231 Encounter for screening mammogram for malignant neoplasm of breast: Secondary | ICD-10-CM

## 2015-07-27 ENCOUNTER — Ambulatory Visit (HOSPITAL_COMMUNITY)
Admission: RE | Admit: 2015-07-27 | Discharge: 2015-07-27 | Disposition: A | Discharge status: Home / Self Care | Payer: Medicare Other | Source: Ambulatory Visit | Attending: Family Medicine | Admitting: Family Medicine

## 2015-07-27 DIAGNOSIS — Z1231 Encounter for screening mammogram for malignant neoplasm of breast: Secondary | ICD-10-CM | POA: Diagnosis present

## 2015-08-23 DIAGNOSIS — F39 Unspecified mood [affective] disorder: Secondary | ICD-10-CM | POA: Diagnosis not present

## 2015-08-23 DIAGNOSIS — Z8673 Personal history of transient ischemic attack (TIA), and cerebral infarction without residual deficits: Secondary | ICD-10-CM | POA: Diagnosis not present

## 2015-08-23 DIAGNOSIS — R4189 Other symptoms and signs involving cognitive functions and awareness: Secondary | ICD-10-CM | POA: Diagnosis not present

## 2015-08-23 DIAGNOSIS — F419 Anxiety disorder, unspecified: Secondary | ICD-10-CM | POA: Diagnosis not present

## 2015-08-23 DIAGNOSIS — I69398 Other sequelae of cerebral infarction: Secondary | ICD-10-CM | POA: Diagnosis not present

## 2015-08-23 DIAGNOSIS — S069X5S Unspecified intracranial injury with loss of consciousness greater than 24 hours with return to pre-existing conscious level, sequela: Secondary | ICD-10-CM | POA: Diagnosis not present

## 2015-08-23 DIAGNOSIS — R269 Unspecified abnormalities of gait and mobility: Secondary | ICD-10-CM | POA: Diagnosis not present

## 2015-08-23 DIAGNOSIS — R413 Other amnesia: Secondary | ICD-10-CM | POA: Diagnosis not present

## 2015-08-23 DIAGNOSIS — G3184 Mild cognitive impairment, so stated: Secondary | ICD-10-CM | POA: Diagnosis not present

## 2015-08-23 DIAGNOSIS — F3131 Bipolar disorder, current episode depressed, mild: Secondary | ICD-10-CM | POA: Diagnosis not present

## 2015-09-08 DIAGNOSIS — R1310 Dysphagia, unspecified: Secondary | ICD-10-CM | POA: Diagnosis not present

## 2015-09-08 DIAGNOSIS — G710 Muscular dystrophy: Secondary | ICD-10-CM | POA: Diagnosis not present

## 2015-09-08 DIAGNOSIS — R471 Dysarthria and anarthria: Secondary | ICD-10-CM | POA: Diagnosis not present

## 2015-09-14 IMAGING — US US TRANSVAGINAL NON-OB
1 series · 14 of 25 positions shown · non-contrast
Comparison: None

CLINICAL DATA: Unspecified dysplasia of cervix

EXAM:
TRANSABDOMINAL AND TRANSVAGINAL ULTRASOUND OF PELVIS
TECHNIQUE: Both transabdominal and transvaginal ultrasound examinations of the
pelvis were performed. Transabdominal technique was performed for
global imaging of the pelvis including uterus, ovaries, adnexal
regions, and pelvic cul-de-sac. It was necessary to proceed with
endovaginal exam following the transabdominal exam to visualize the
endometrium and ovaries.

[Series 1: us transvaginal non-ob · 0.17mm/px · 14 of 72 slices shown]
[im 1/72]
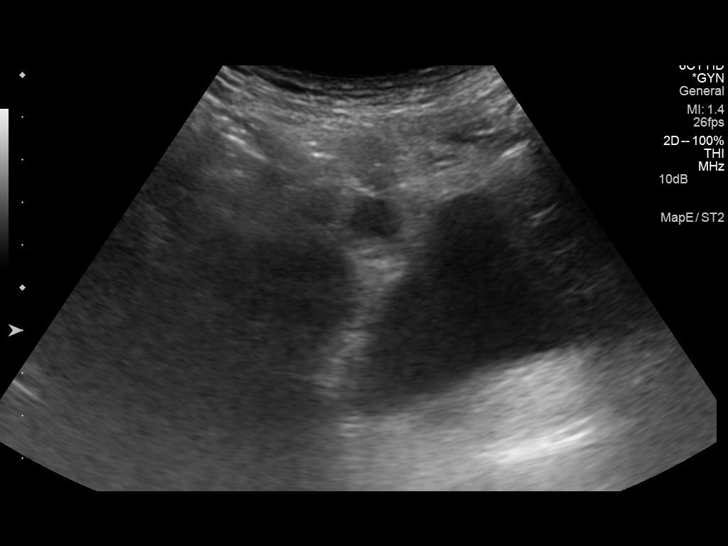
[im 6/72]
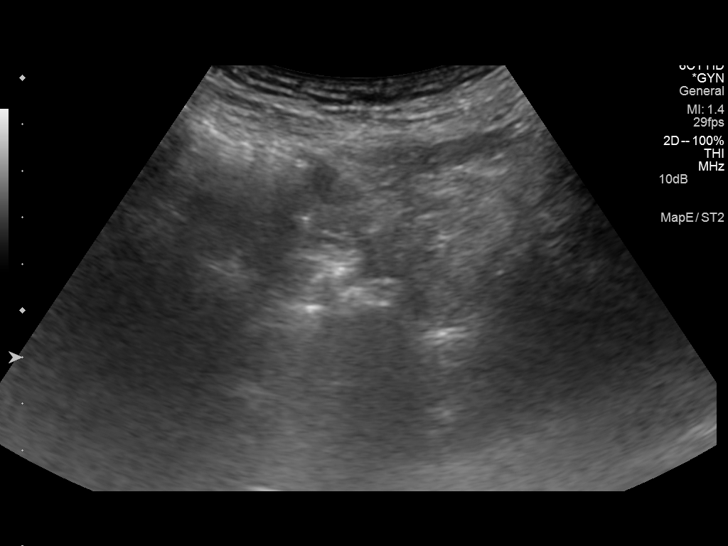
[im 12/72]
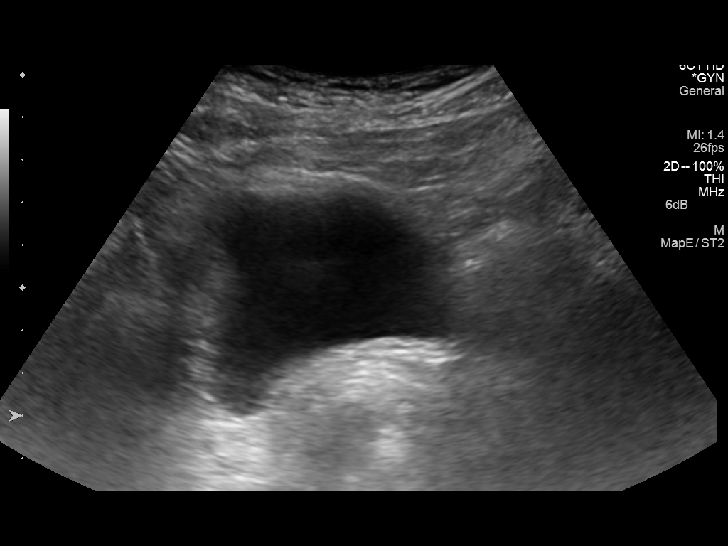
[im 18/72]
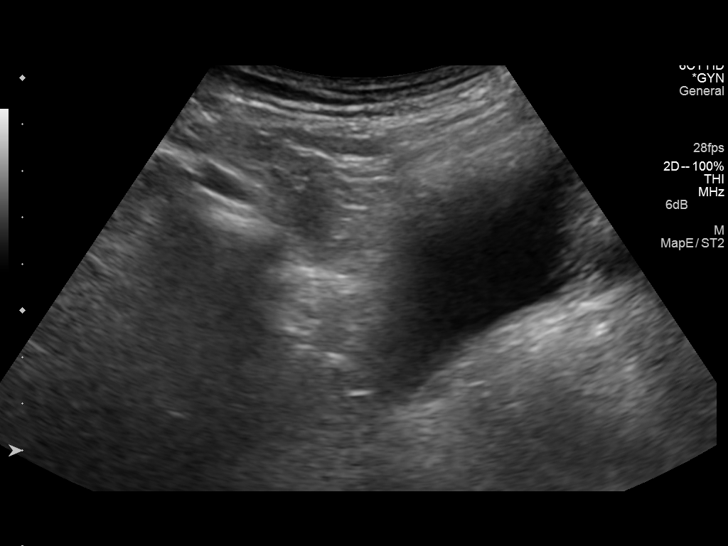
[im 24/72]
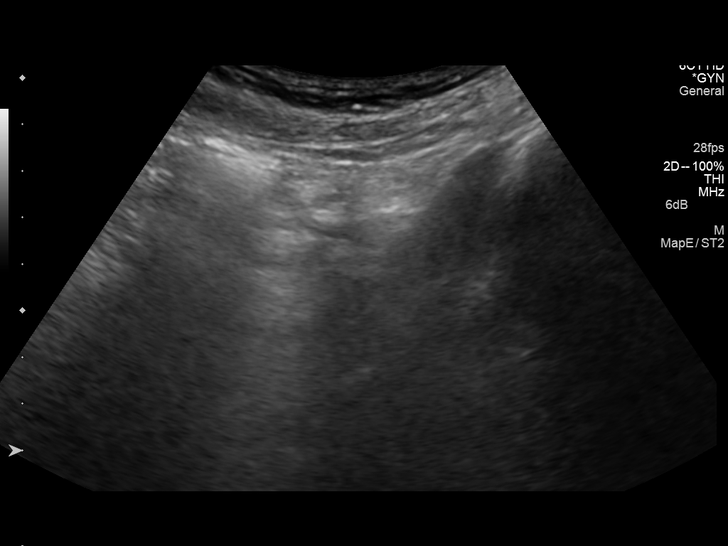
[im 27/72]
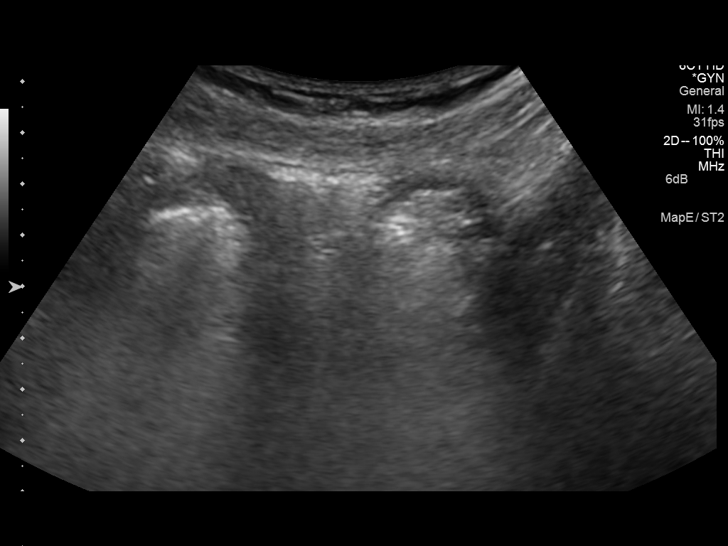
[im 33/72]
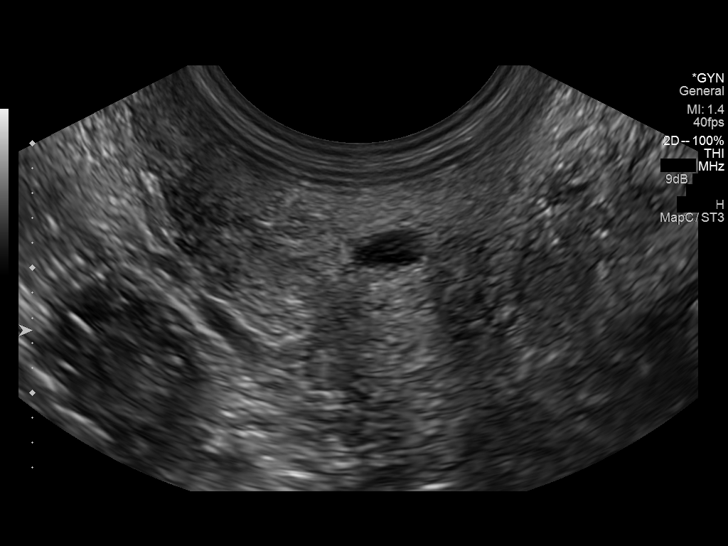
[im 39/72]
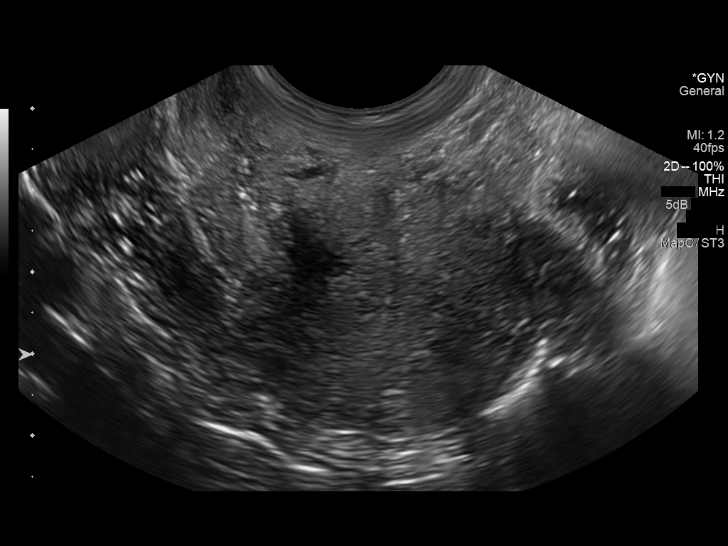
[im 45/72]
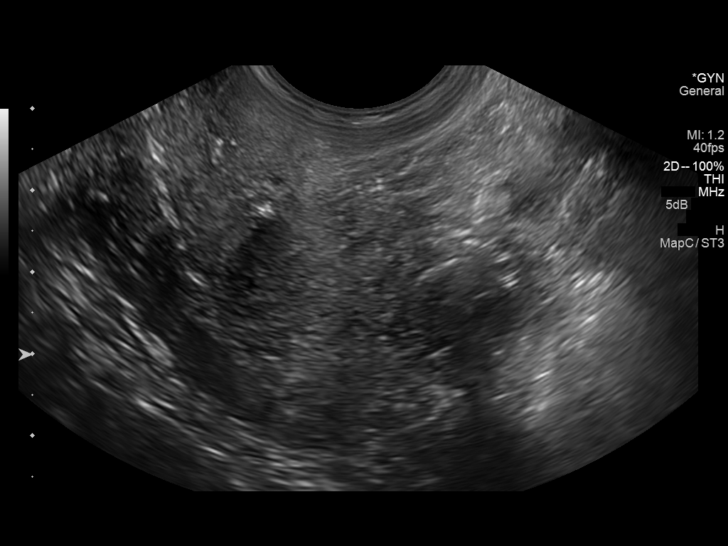
[im 48/72]
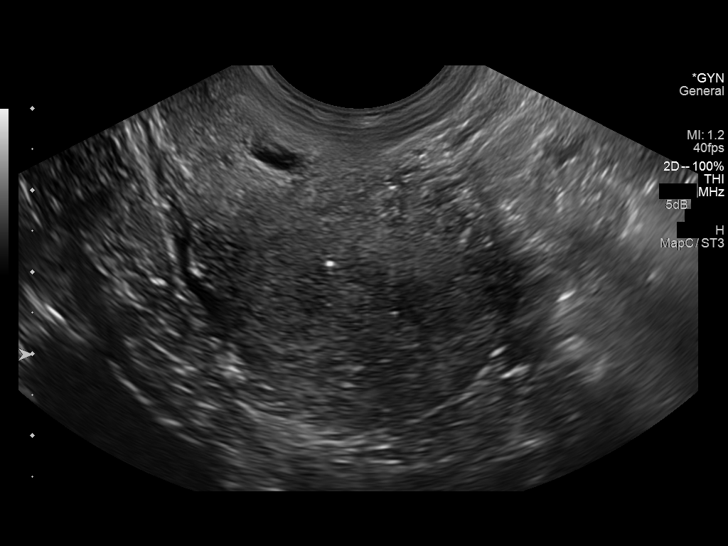
[im 54/72]
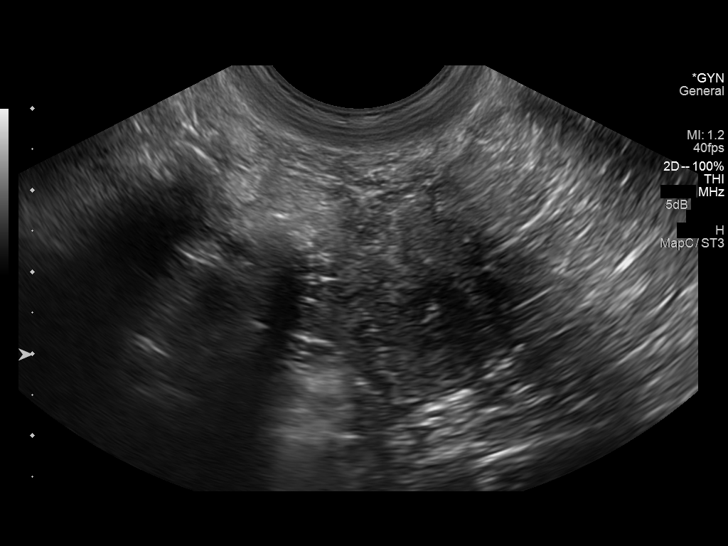
[im 60/72]
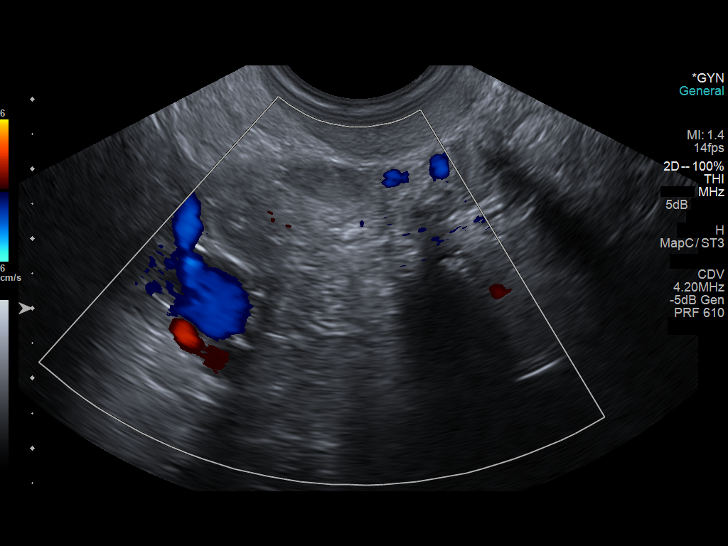
[im 66/72]
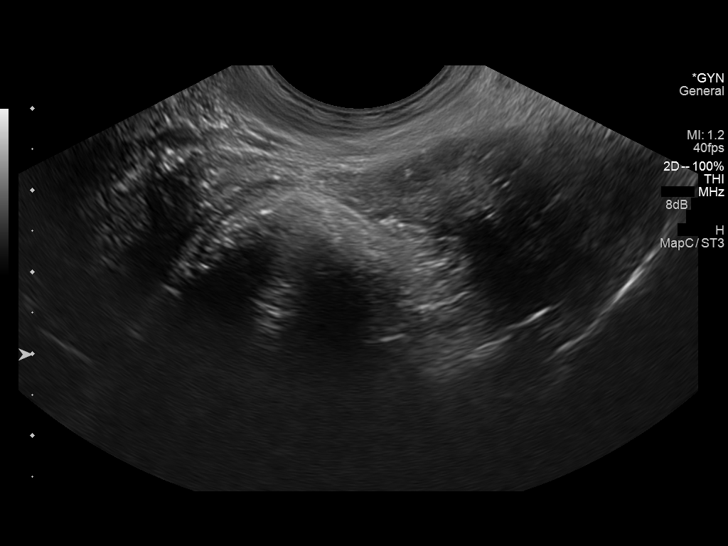
[im 72/72]
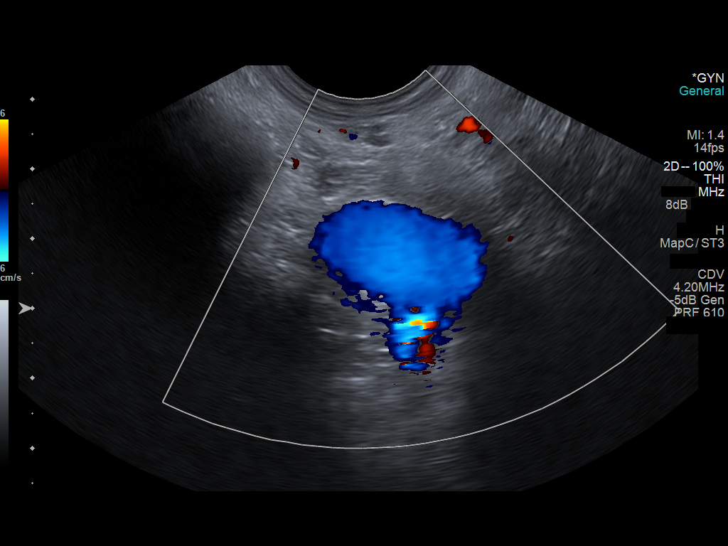

[14 of 25 positions shown; findings below may reference images not displayed]

FINDINGS: Uterus

Measurements: 5.2 x 3.2 x 2.7 cm. 5 x 4 x 7 mm cyst in the cervical
region. No other abnormalities.

Endometrium

Thickness: 5 mm.  No focal abnormality visualized.

Neither ovary was visualized.  There is no free fluid.
IMPRESSION: Probable nabothian cyst in the cervix. Limited study in that bowel
gas obscures the ovaries.

## 2015-09-21 DIAGNOSIS — R1313 Dysphagia, pharyngeal phase: Secondary | ICD-10-CM | POA: Diagnosis not present

## 2015-09-21 DIAGNOSIS — R633 Feeding difficulties: Secondary | ICD-10-CM | POA: Diagnosis not present

## 2015-09-29 ENCOUNTER — Other Ambulatory Visit (HOSPITAL_COMMUNITY): Payer: Self-pay | Admitting: Psychiatry

## 2015-12-19 ENCOUNTER — Other Ambulatory Visit (HOSPITAL_COMMUNITY): Payer: Self-pay | Admitting: Neurology

## 2015-12-19 DIAGNOSIS — Z886 Allergy status to analgesic agent status: Secondary | ICD-10-CM | POA: Diagnosis not present

## 2015-12-19 DIAGNOSIS — H02409 Unspecified ptosis of unspecified eyelid: Secondary | ICD-10-CM | POA: Diagnosis not present

## 2015-12-19 DIAGNOSIS — R413 Other amnesia: Secondary | ICD-10-CM

## 2015-12-19 DIAGNOSIS — Z87891 Personal history of nicotine dependence: Secondary | ICD-10-CM | POA: Diagnosis not present

## 2015-12-19 DIAGNOSIS — G710 Muscular dystrophy: Secondary | ICD-10-CM | POA: Diagnosis not present

## 2016-01-03 ENCOUNTER — Ambulatory Visit (HOSPITAL_COMMUNITY)
Admission: RE | Admit: 2016-01-03 | Discharge: 2016-01-03 | Disposition: A | Discharge status: Home / Self Care | Payer: Medicare Other | Source: Ambulatory Visit | Attending: Neurology | Admitting: Neurology

## 2016-01-03 DIAGNOSIS — R410 Disorientation, unspecified: Secondary | ICD-10-CM | POA: Diagnosis not present

## 2016-01-03 DIAGNOSIS — R413 Other amnesia: Secondary | ICD-10-CM

## 2016-01-23 DIAGNOSIS — F4323 Adjustment disorder with mixed anxiety and depressed mood: Secondary | ICD-10-CM | POA: Diagnosis not present

## 2016-02-16 DIAGNOSIS — F4323 Adjustment disorder with mixed anxiety and depressed mood: Secondary | ICD-10-CM | POA: Diagnosis not present

## 2016-02-16 DIAGNOSIS — F411 Generalized anxiety disorder: Secondary | ICD-10-CM | POA: Diagnosis not present

## 2016-03-06 DIAGNOSIS — R471 Dysarthria and anarthria: Secondary | ICD-10-CM | POA: Diagnosis not present

## 2016-03-06 DIAGNOSIS — G710 Muscular dystrophy: Secondary | ICD-10-CM | POA: Diagnosis not present

## 2016-03-06 DIAGNOSIS — R1312 Dysphagia, oropharyngeal phase: Secondary | ICD-10-CM | POA: Diagnosis not present

## 2016-03-14 DIAGNOSIS — F331 Major depressive disorder, recurrent, moderate: Secondary | ICD-10-CM | POA: Diagnosis not present

## 2016-03-20 DIAGNOSIS — F4323 Adjustment disorder with mixed anxiety and depressed mood: Secondary | ICD-10-CM | POA: Diagnosis not present

## 2016-03-20 DIAGNOSIS — F411 Generalized anxiety disorder: Secondary | ICD-10-CM | POA: Diagnosis not present

## 2016-04-30 DIAGNOSIS — F4323 Adjustment disorder with mixed anxiety and depressed mood: Secondary | ICD-10-CM | POA: Diagnosis not present

## 2016-04-30 DIAGNOSIS — F411 Generalized anxiety disorder: Secondary | ICD-10-CM | POA: Diagnosis not present

## 2016-05-17 DIAGNOSIS — G710 Muscular dystrophy: Secondary | ICD-10-CM | POA: Diagnosis not present

## 2016-05-17 DIAGNOSIS — Z6821 Body mass index (BMI) 21.0-21.9, adult: Secondary | ICD-10-CM | POA: Diagnosis not present

## 2016-05-17 DIAGNOSIS — Z0000 Encounter for general adult medical examination without abnormal findings: Secondary | ICD-10-CM | POA: Diagnosis not present

## 2016-05-17 DIAGNOSIS — E782 Mixed hyperlipidemia: Secondary | ICD-10-CM | POA: Diagnosis not present

## 2016-05-17 DIAGNOSIS — Z1389 Encounter for screening for other disorder: Secondary | ICD-10-CM | POA: Diagnosis not present

## 2016-05-17 DIAGNOSIS — E039 Hypothyroidism, unspecified: Secondary | ICD-10-CM | POA: Diagnosis not present

## 2016-06-13 ENCOUNTER — Telehealth: Payer: Self-pay

## 2016-06-13 NOTE — Telephone Encounter (Signed)
Pt states she is due for a tcs and is having no issues   Please call [phone]

## 2016-06-15 NOTE — Telephone Encounter (Signed)
Tried to call x 2 and the phone would not ring.

## 2016-06-21 ENCOUNTER — Telehealth: Payer: Self-pay

## 2016-06-21 NOTE — Telephone Encounter (Signed)
Triaged today.  

## 2016-07-05 NOTE — Telephone Encounter (Signed)
Gastroenterology Pre-Procedure Review  Request Date: 06/21/2016 Requesting Physician: Dr. Hilma Favors  PATIENT REVIEW QUESTIONS: The patient responded to the following health history questions as indicated:    1. Diabetes Melitis: no 2. Joint replacements in the past 12 months: no 3. Major health problems in the past 3 months: no 4. Has an artificial valve or MVP: no 5. Has a defibrillator: no 6. Has been advised in past to take antibiotics in advance of a procedure like teeth cleaning: no 7. Family history of colon cancer: no  8. Alcohol Use: no 9. History of sleep apnea: no  10. History of coronary artery or other vascular stents placed within the last 12 months: no    MEDICATIONS & ALLERGIES:    Patient reports the following regarding taking any blood thinners:   Plavix? no Aspirin? no Coumadin? no Brilinta? no Xarelto? no Eliquis? no Pradaxa? no Savaysa? no Effient? no  Patient confirms/reports the following medications:  Current Outpatient Prescriptions  Medication Sig Dispense Refill  . atorvastatin (LIPITOR) 20 MG tablet Take 20 mg by mouth daily.     Marland Kitchen escitalopram (LEXAPRO) 20 MG tablet Take 10 mg by mouth daily.     Marland Kitchen levothyroxine (SYNTHROID, LEVOTHROID) 112 MCG tablet Take 100 mcg by mouth daily.     Marland Kitchen NEXIUM 40 MG capsule Take 20 mg by mouth daily before breakfast.     . polyethylene glycol-electrolytes (TRILYTE) 420 G solution Take 4,000 mLs by mouth as directed. 4000 mL 0   No current facility-administered medications for this visit.     Patient confirms/reports the following allergies:  No Known Allergies  No orders of the defined types were placed in this encounter.   AUTHORIZATION INFORMATION Primary Insurance:   ID #:  Group #:  Pre-Cert / Auth required:  Pre-Cert / Auth #:   Secondary Insurance:  ID #:  Group #:  Pre-Cert / Auth required:  Pre-Cert / Auth #:   SCHEDULE INFORMATION: Procedure has been scheduled as follows:  Date:               Time:   Location:   This Gastroenterology Pre-Precedure Review Form is being routed to the following provider(s): R. Garfield Cornea, MD

## 2016-07-05 NOTE — Telephone Encounter (Signed)
Please ask patient if she will be able to consume bowel prep ok. She has progressive muscular dystrophy, oculopharyngeal type.   If she generally is able to swallow liquids okay then can proceed with colonoscopy.

## 2016-07-06 NOTE — Telephone Encounter (Signed)
Pt said she is able to swallow liquids. I have a Suprep sample to give her and will print instructions for her to pick up when she picks up the prep.

## 2016-07-10 ENCOUNTER — Other Ambulatory Visit: Payer: Self-pay

## 2016-07-10 DIAGNOSIS — Z1211 Encounter for screening for malignant neoplasm of colon: Secondary | ICD-10-CM

## 2016-07-10 NOTE — Telephone Encounter (Signed)
Pt's husband is aware to come by and pick up the prep and instructions.

## 2016-07-25 ENCOUNTER — Encounter (HOSPITAL_COMMUNITY)
Admission: RE | Disposition: A | Discharge status: Home / Self Care | Payer: Self-pay | Source: Ambulatory Visit | Attending: Internal Medicine

## 2016-07-25 ENCOUNTER — Encounter (HOSPITAL_COMMUNITY): Payer: Self-pay | Admitting: *Deleted

## 2016-07-25 ENCOUNTER — Ambulatory Visit (HOSPITAL_COMMUNITY)
Admission: RE | Admit: 2016-07-25 | Discharge: 2016-07-25 | Disposition: A | Discharge status: Home / Self Care | Payer: Medicare Other | Source: Ambulatory Visit | Attending: Internal Medicine | Admitting: Internal Medicine

## 2016-07-25 DIAGNOSIS — Z87891 Personal history of nicotine dependence: Secondary | ICD-10-CM | POA: Insufficient documentation

## 2016-07-25 DIAGNOSIS — E039 Hypothyroidism, unspecified: Secondary | ICD-10-CM | POA: Diagnosis not present

## 2016-07-25 DIAGNOSIS — Z79899 Other long term (current) drug therapy: Secondary | ICD-10-CM | POA: Insufficient documentation

## 2016-07-25 DIAGNOSIS — E785 Hyperlipidemia, unspecified: Secondary | ICD-10-CM | POA: Insufficient documentation

## 2016-07-25 DIAGNOSIS — G710 Muscular dystrophy: Secondary | ICD-10-CM | POA: Diagnosis not present

## 2016-07-25 DIAGNOSIS — K219 Gastro-esophageal reflux disease without esophagitis: Secondary | ICD-10-CM | POA: Insufficient documentation

## 2016-07-25 DIAGNOSIS — Z1212 Encounter for screening for malignant neoplasm of rectum: Secondary | ICD-10-CM | POA: Diagnosis not present

## 2016-07-25 DIAGNOSIS — Z1211 Encounter for screening for malignant neoplasm of colon: Secondary | ICD-10-CM | POA: Insufficient documentation

## 2016-07-25 HISTORY — PX: COLONOSCOPY: SHX5424

## 2016-07-25 SURGERY — COLONOSCOPY
Anesthesia: Moderate Sedation

## 2016-07-25 MED ORDER — SIMETHICONE 40 MG/0.6ML PO SUSP
ORAL | Status: DC | PRN
  Administered 2016-07-25: 09:00:00

## 2016-07-25 MED ORDER — ONDANSETRON HCL 4 MG/2ML IJ SOLN
INTRAMUSCULAR | Status: DC | PRN
  Administered 2016-07-25: 4 mg via INTRAVENOUS

## 2016-07-25 MED ORDER — MEPERIDINE HCL 100 MG/ML IJ SOLN
INTRAMUSCULAR | Status: AC
  Filled 2016-07-25: qty 2

## 2016-07-25 MED ORDER — MEPERIDINE HCL 100 MG/ML IJ SOLN
INTRAMUSCULAR | Status: DC | PRN
  Administered 2016-07-25: 50 mg via INTRAVENOUS

## 2016-07-25 MED ORDER — MIDAZOLAM HCL 5 MG/5ML IJ SOLN
INTRAMUSCULAR | Status: AC
  Filled 2016-07-25: qty 10

## 2016-07-25 MED ORDER — SODIUM CHLORIDE 0.9 % IV SOLN
INTRAVENOUS | Status: DC
  Administered 2016-07-25: 1000 mL via INTRAVENOUS

## 2016-07-25 MED ORDER — MIDAZOLAM HCL 5 MG/5ML IJ SOLN
INTRAMUSCULAR | Status: DC | PRN
  Administered 2016-07-25: 1 mg via INTRAVENOUS
  Administered 2016-07-25: 2 mg via INTRAVENOUS
  Administered 2016-07-25: 1 mg via INTRAVENOUS

## 2016-07-25 MED ORDER — ONDANSETRON HCL 4 MG/2ML IJ SOLN
INTRAMUSCULAR | Status: AC
  Filled 2016-07-25: qty 2

## 2016-07-25 NOTE — Op Note (Signed)
[facility] Patient Name: [name] Procedure Date: 07/25/2016 8:54 AM MRN: [id number] Date of Birth: 10/15/1950 Attending MD: Norvel Richards , MD CSN: [id number] Age: 65 Admit Type: Outpatient Procedure:                Colonoscopy - screening Indications:              Screening for colorectal malignant neoplasm Providers:                Norvel Richards, MD, Otis Peak B. Sharon Seller, RN,                            Aram Candela, Randa Spike, Technician Referring MD:              Medicines:                Midazolam 4 mg IV, Meperidine 50 mg IV Complications:            No immediate complications. Estimated Blood Loss:     Estimated blood loss: none. Procedure:                Pre-Anesthesia Assessment:                           - Prior to the procedure, a History and Physical                            was performed, and patient medications and                            allergies were reviewed. The patient's tolerance of                            previous anesthesia was also reviewed. The risks                            and benefits of the procedure and the sedation                            options and risks were discussed with the patient.                            All questions were answered, and informed consent                            was obtained. Prior Anticoagulants: The patient has                            taken no previous anticoagulant or antiplatelet                            agents. ASA Grade Assessment: II - A patient with                            mild systemic disease. After reviewing the risks  and benefits, the patient was deemed in                            satisfactory condition to undergo the procedure.                           After obtaining informed consent, the colonoscope                            was passed under direct vision. Throughout the                            procedure, the patient's blood  pressure, pulse, and                            oxygen saturations were monitored continuously. The                            [id number]) was introduced through the anus                            and advanced to the the cecum, identified by                            appendiceal orifice and ileocecal valve. The                            terminal ileum, ileocecal valve, appendiceal                            orifice, and rectum and the ileocecal valve,                            appendiceal orifice, and rectum were photographed.                            The entire colon was well visualized. The ileocecal                            valve, appendiceal orifice, and rectum were                            photographed. The colonoscopy was performed without                            difficulty. The patient tolerated the procedure                            well. The quality of the bowel preparation was                            adequate. The entire colon was well visualized. Scope In: 9:24:36 AM Scope Out: 9:38:29 AM Scope Withdrawal Time: 0 hours 6 minutes 23 seconds  Total Procedure Duration: 0  hours 13 minutes 53 seconds  Findings:      The perianal and digital rectal examinations were normal.      The entire examined colon appeared normal on direct and retroflexion       views. Impression:               - The entire examined colon is normal on direct and                            retroflexion views.                           - No specimens collected. Moderate Sedation:      Moderate (conscious) sedation was administered by the endoscopy nurse       and supervised by the endoscopist. The following parameters were       monitored: oxygen saturation, heart rate, blood pressure, respiratory       rate, EKG, adequacy of pulmonary ventilation, and response to care.       Total physician intraservice time was 21 minutes. Recommendation:           - Patient has a contact number  available for                            emergencies. The signs and symptoms of potential                            delayed complications were discussed with the                            patient. Return to normal activities tomorrow.                            Written discharge instructions were provided to the                            patient.                           - Resume previous diet.                           - Continue present medications.                           - Repeat colonoscopy in 10 years for screening                            purposes.                           - Return to GI office PRN. Procedure Code(s):        --- Professional ---                           [phone], Colonoscopy, flexible; diagnostic, including  collection of specimen(s) by brushing or washing,                            when performed (separate procedure)                           99152, Moderate sedation services provided by the                            same physician or other qualified health care                            professional performing the diagnostic or                            therapeutic service that the sedation supports,                            requiring the presence of an independent trained                            observer to assist in the monitoring of the                            patient's level of consciousness and physiological                            status; initial 15 minutes of intraservice time,                            patient age 69 years or older Diagnosis Code(s):        --- Professional ---                           Z12.11, Encounter for screening for malignant                            neoplasm of colon CPT copyright 2016 [employer] Medical [employer]. All rights reserved. The codes documented in this report are preliminary and upon coder review may  be revised to meet current compliance requirements. Cristopher Estimable. Viveca Beckstrom,  MD Norvel Richards, MD 07/25/2016 9:56:24 AM This report has been signed electronically. Number of Addenda: 0

## 2016-07-25 NOTE — Discharge Instructions (Signed)
°  Colonoscopy Discharge Instructions  Read the instructions outlined below and refer to this sheet in the next few weeks. These discharge instructions provide you with general information on caring for yourself after you leave the hospital. Your doctor may also give you specific instructions. While your treatment has been planned according to the most current medical practices available, unavoidable complications occasionally occur. If you have any problems or questions after discharge, call Dr. Gala Romney at [phone]. ACTIVITY  You may resume your regular activity, but move at a slower pace for the next 24 hours.   Take frequent rest periods for the next 24 hours.   Walking will help get rid of the air and reduce the bloated feeling in your belly (abdomen).   No driving for 24 hours (because of the medicine (anesthesia) used during the test).    Do not sign any important legal documents or operate any machinery for 24 hours (because of the anesthesia used during the test).  NUTRITION  Drink plenty of fluids.   You may resume your normal diet as instructed by your doctor.   Begin with a light meal and progress to your normal diet. Heavy or fried foods are harder to digest and may make you feel sick to your stomach (nauseated).   Avoid alcoholic beverages for 24 hours or as instructed.  MEDICATIONS  You may resume your normal medications unless your doctor tells you otherwise.  WHAT YOU CAN EXPECT TODAY  Some feelings of bloating in the abdomen.   Passage of more gas than usual.   Spotting of blood in your stool or on the toilet paper.  IF YOU HAD POLYPS REMOVED DURING THE COLONOSCOPY:  No aspirin products for 7 days or as instructed.   No alcohol for 7 days or as instructed.   Eat a soft diet for the next 24 hours.  FINDING OUT THE RESULTS OF YOUR TEST Not all test results are available during your visit. If your test results are not back during the visit, make an appointment  with your caregiver to find out the results. Do not assume everything is normal if you have not heard from your caregiver or the medical facility. It is important for you to follow up on all of your test results.  SEEK IMMEDIATE MEDICAL ATTENTION IF:  You have more than a spotting of blood in your stool.   Your belly is swollen (abdominal distention).   You are nauseated or vomiting.   You have a temperature over 101.   You have abdominal pain or discomfort that is severe or gets worse throughout the day.    Repeat colonoscopy for screening purposes in 10 years

## 2016-07-25 NOTE — H&P (Signed)
@LOGO @   Primary Care Physician:  Purvis Kilts, MD Primary Gastroenterologist:  Dr. Gala Romney  Pre-Procedure History & Physical: HPI:  [name] is a 65 y.o. female is here for a screening colonoscopy. Negative colonoscopy 2005. No bowel symptoms. No family history of colon cancer.  Past Medical History:  Diagnosis Date  . Broken arm    right  . GERD (gastroesophageal reflux disease)   . Hyperlipidemia   . Hypothyroidism   . Muscular dystrophy ([place])   . Oculopharyngeal muscular dystrophy [facility])     Past Surgical History:  Procedure Laterality Date  . BELPHAROPTOSIS REPAIR Bilateral   . COLONOSCOPY  Oc 2005   normal colonoscopy  . pH and manometry   04/28/2002   RMR: Nonspecific motility disorder, DeMeester score 5.3, adequate control of reflux  . throat muscle removed      Prior to Admission medications   Medication Sig Start Date End Date Taking? Authorizing Provider  atorvastatin (LIPITOR) 20 MG tablet Take 20 mg by mouth daily.  06/09/12  Yes Historical Provider, MD  diphenhydrAMINE (BENADRYL) 25 MG tablet Take 25 mg by mouth at bedtime as needed for sleep.   Yes Historical Provider, MD  escitalopram (LEXAPRO) 20 MG tablet Take 10 mg by mouth daily.  06/09/12  Yes Historical Provider, MD  esomeprazole (NEXIUM) 20 MG capsule Take 20 mg by mouth daily.   Yes Historical Provider, MD  levothyroxine (SYNTHROID, LEVOTHROID) 100 MCG tablet Take 100 mcg by mouth daily before breakfast.   Yes Historical Provider, MD  Multiple Vitamin (MULTIVITAMIN WITH MINERALS) TABS tablet Take 1 tablet by mouth daily.   Yes Historical Provider, MD  polyethylene glycol-electrolytes (TRILYTE) 420 G solution Take 4,000 mLs by mouth as directed. 06/23/12  Yes Daneil Dolin, MD    Allergies as of 07/10/2016  . (No Known Allergies)    Family History  Problem Relation Age of Onset  . Hypothyroidism Mother   . Varicose Veins Mother   . Heart disease Mother   . Muscular dystrophy Father    . Obesity Sister   . Muscular dystrophy Brother   . Obesity Brother   . Obesity Brother   . Colon cancer Neg Hx     Social History   Social History  . Marital status: Married    Spouse name: N/A  . Number of children: 2  . Years of education: N/A   Occupational History  . retired Engineer, drilling   Social History Main Topics  . Smoking status: Former Research scientist (life sciences)  . Smokeless tobacco: Never Used     Comment: quit 1979  . Alcohol use No  . Drug use: No  . Sexual activity: Not on file   Other Topics Concern  . Not on file   Social History Narrative  . No narrative on file    Review of Systems: See HPI, otherwise negative ROS  Physical Exam: BP (!) 150/77   Pulse (!) 53   Temp 97.5 F (36.4 C) (Oral)   Resp 11   Ht 5' 2.5" (1.588 m)   Wt 110 lb (49.9 kg)   SpO2 100%   BMI 19.80 kg/m  General:   Alert,  Well-developed, well-nourished, pleasant and cooperative in NAD Neck:  Supple; no masses or thyromegaly. Lungs:  Clear throughout to auscultation.   No wheezes, crackles, or rhonchi. No acute distress. Heart:  Regular rate and rhythm; no murmurs, clicks, rubs,  or gallops. Abdomen:  Soft, nontender and nondistended.  No masses, hepatosplenomegaly or hernias noted. Normal bowel sounds, without guarding, and without rebound.    Impression/Plan: [name] is now here to undergo a screening colonoscopy.  Average risk screening examination.  Risks, benefits, limitations, imponderables and alternatives regarding colonoscopy have been reviewed with the patient. Questions have been answered. All parties agreeable.         Notice:  This dictation was prepared with Dragon dictation along with smaller phrase technology. Any transcriptional errors that result from this process are unintentional and may not be corrected upon review.

## 2016-07-31 ENCOUNTER — Encounter (HOSPITAL_COMMUNITY): Payer: Self-pay | Admitting: Internal Medicine

## 2016-09-03 ENCOUNTER — Other Ambulatory Visit (HOSPITAL_COMMUNITY): Payer: Self-pay | Admitting: Family Medicine

## 2016-09-03 DIAGNOSIS — Z1231 Encounter for screening mammogram for malignant neoplasm of breast: Secondary | ICD-10-CM

## 2016-09-14 ENCOUNTER — Ambulatory Visit (HOSPITAL_COMMUNITY): Payer: Medicare Other

## 2016-09-21 ENCOUNTER — Ambulatory Visit (HOSPITAL_COMMUNITY): Payer: Medicare Other

## 2016-09-24 ENCOUNTER — Ambulatory Visit (HOSPITAL_COMMUNITY)
Admission: RE | Admit: 2016-09-24 | Discharge: 2016-09-24 | Disposition: A | Discharge status: Home / Self Care | Payer: Medicare Other | Source: Ambulatory Visit | Attending: Family Medicine | Admitting: Family Medicine

## 2016-09-24 DIAGNOSIS — Z1231 Encounter for screening mammogram for malignant neoplasm of breast: Secondary | ICD-10-CM | POA: Diagnosis present

## 2017-02-12 DIAGNOSIS — G710 Muscular dystrophy: Secondary | ICD-10-CM | POA: Diagnosis not present

## 2017-02-12 DIAGNOSIS — G3184 Mild cognitive impairment, so stated: Secondary | ICD-10-CM | POA: Diagnosis not present

## 2017-03-20 DIAGNOSIS — H02423 Myogenic ptosis of bilateral eyelids: Secondary | ICD-10-CM | POA: Diagnosis not present

## 2017-03-28 DIAGNOSIS — R413 Other amnesia: Secondary | ICD-10-CM | POA: Diagnosis not present

## 2017-03-28 DIAGNOSIS — R1313 Dysphagia, pharyngeal phase: Secondary | ICD-10-CM | POA: Diagnosis not present

## 2017-03-28 DIAGNOSIS — R471 Dysarthria and anarthria: Secondary | ICD-10-CM | POA: Diagnosis not present

## 2017-03-28 DIAGNOSIS — G710 Muscular dystrophy: Secondary | ICD-10-CM | POA: Diagnosis not present

## 2017-03-28 DIAGNOSIS — K219 Gastro-esophageal reflux disease without esophagitis: Secondary | ICD-10-CM | POA: Diagnosis not present

## 2017-03-28 DIAGNOSIS — R1312 Dysphagia, oropharyngeal phase: Secondary | ICD-10-CM | POA: Diagnosis not present

## 2017-04-23 DIAGNOSIS — H02423 Myogenic ptosis of bilateral eyelids: Secondary | ICD-10-CM | POA: Diagnosis not present

## 2017-05-07 DIAGNOSIS — Z9889 Other specified postprocedural states: Secondary | ICD-10-CM | POA: Diagnosis not present

## 2017-07-05 DIAGNOSIS — Z0001 Encounter for general adult medical examination with abnormal findings: Secondary | ICD-10-CM | POA: Diagnosis not present

## 2017-07-05 DIAGNOSIS — Z1389 Encounter for screening for other disorder: Secondary | ICD-10-CM | POA: Diagnosis not present

## 2017-07-05 DIAGNOSIS — Z6822 Body mass index (BMI) 22.0-22.9, adult: Secondary | ICD-10-CM | POA: Diagnosis not present

## 2017-10-11 ENCOUNTER — Emergency Department (HOSPITAL_COMMUNITY)
Admission: EM | Admit: 2017-10-11 | Discharge: 2017-10-12 | Disposition: A | Discharge status: Home / Self Care | Payer: Medicare HMO | Attending: Emergency Medicine | Admitting: Emergency Medicine

## 2017-10-11 DIAGNOSIS — T783XXA Angioneurotic edema, initial encounter: Secondary | ICD-10-CM | POA: Diagnosis not present

## 2017-10-11 DIAGNOSIS — E039 Hypothyroidism, unspecified: Secondary | ICD-10-CM | POA: Insufficient documentation

## 2017-10-11 DIAGNOSIS — R600 Localized edema: Secondary | ICD-10-CM | POA: Diagnosis present

## 2017-10-11 DIAGNOSIS — Z79899 Other long term (current) drug therapy: Secondary | ICD-10-CM | POA: Insufficient documentation

## 2017-10-11 DIAGNOSIS — Z87891 Personal history of nicotine dependence: Secondary | ICD-10-CM | POA: Diagnosis not present

## 2017-10-11 DIAGNOSIS — R220 Localized swelling, mass and lump, head: Secondary | ICD-10-CM | POA: Diagnosis not present

## 2017-10-11 NOTE — ED Triage Notes (Signed)
Pt with onset of tongue and lip swelling approx 1630, took benadryl and epi pen for same with improvement.  Pt states her lip started swelling more again tonight.  Pt states it feels diff to swallow, but no trouble breathing.

## 2017-10-12 ENCOUNTER — Encounter (HOSPITAL_COMMUNITY): Payer: Self-pay

## 2017-10-12 MED ORDER — EPINEPHRINE PF 1 MG/ML IJ SOLN
INTRAMUSCULAR | Status: AC
  Administered 2017-10-12: 0.3 mg
  Filled 2017-10-12: qty 1

## 2017-10-12 MED ORDER — METHYLPREDNISOLONE SODIUM SUCC 125 MG IJ SOLR
125.0000 mg | Freq: Once | INTRAMUSCULAR | Status: AC
  Administered 2017-10-12: 125 mg via INTRAVENOUS
  Filled 2017-10-12: qty 2

## 2017-10-12 MED ORDER — FAMOTIDINE IN NACL 20-0.9 MG/50ML-% IV SOLN
20.0000 mg | Freq: Once | INTRAVENOUS | Status: AC
  Administered 2017-10-12: 20 mg via INTRAVENOUS
  Filled 2017-10-12: qty 50

## 2017-10-12 MED ORDER — DIPHENHYDRAMINE HCL 50 MG/ML IJ SOLN
25.0000 mg | Freq: Once | INTRAMUSCULAR | Status: AC
  Administered 2017-10-12: 25 mg via INTRAVENOUS
  Filled 2017-10-12: qty 1

## 2017-10-12 MED ORDER — EPINEPHRINE PF 1 MG/10ML IJ SOSY
0.3000 mg | PREFILLED_SYRINGE | Freq: Once | INTRAMUSCULAR | Status: DC
  Filled 2017-10-12: qty 10

## 2017-10-12 MED ORDER — EPINEPHRINE 0.3 MG/0.3ML IJ SOAJ: 0.3000 mg | Freq: Once | INTRAMUSCULAR | 0 refills | Status: AC

## 2017-10-12 MED ORDER — PREDNISONE 20 MG PO TABS: ORAL_TABLET | ORAL | 0 refills | Status: DC

## 2017-10-12 NOTE — ED Notes (Signed)
Pt offered a wheelchair but stated her husband would walk her out.

## 2017-10-12 NOTE — ED Provider Notes (Signed)
[facility], [place] EMERGENCY DEPARTMENT Provider Note   CSN: [id number] Arrival date & time: 10/11/17  2346  Time seen 23:58 PM   History   Chief Complaint Chief Complaint  Patient presents with  . Allergic Reaction    HPI [name] is a 67 y.o. female.  HPI history obtained from patient and her daughter.  Patient has had a history of angioedema in the past.  She has had a treated at home and not have to be seen but she is also had to be admitted to the hospital for several days before.  They have not found a source for her angioedema.  About 4 PM this evening patient had some swelling of her tongue and was having some shortness of breath and trouble swallowing.  Her daughter gave her 2 Benadryl and did an EpiPen about 4:30 PM and it totally resolved.  However about 1030 this evening the daughter noticed her lip appeared to be getting swollen again and she also had some swelling of her tongue.  She states she is having trouble swallowing but she is not having trouble breathing now.  She denies any family history of angioedema.  The only thing different recently is patient has been taking over-the-counter Alka-Seltzer cold for a URI and she did change toothpaste recently.  PCP Sharilyn Sites, MD   Past Medical History:  Diagnosis Date  . Broken arm    right  . GERD (gastroesophageal reflux disease)   . Hyperlipidemia   . Hypothyroidism   . Muscular dystrophy   . Oculopharyngeal muscular dystrophy     Patient Active Problem List   Diagnosis Date Noted  . Encounter for screening colonoscopy 06/24/2012  . Dysphagia 06/24/2012    Past Surgical History:  Procedure Laterality Date  . BELPHAROPTOSIS REPAIR Bilateral   . COLONOSCOPY  Oc 2005   normal colonoscopy  . COLONOSCOPY N/A 07/25/2016   Procedure: COLONOSCOPY;  Surgeon: Daneil Dolin, MD;  Location: [facility] ENDO SUITE;  Service: Endoscopy;  Laterality: N/A;  9:15 AM  . pH and manometry   04/28/2002   RMR: Nonspecific motility  disorder, DeMeester score 5.3, adequate control of reflux  . throat muscle removed      OB History    No data available       Home Medications    Prior to Admission medications   Medication Sig Start Date End Date Taking? Authorizing Provider  atorvastatin (LIPITOR) 20 MG tablet Take 20 mg by mouth daily.  06/09/12   [provider]  diphenhydrAMINE (BENADRYL) 25 MG tablet Take 25 mg by mouth at bedtime as needed for sleep.    [provider]  EPINEPHrine (EPIPEN 2-PAK) 0.3 mg/0.3 mL IJ SOAJ injection Inject 0.3 mLs (0.3 mg total) into the muscle once for 1 dose. 10/12/17 10/12/17  Rolland Porter, MD  escitalopram (LEXAPRO) 20 MG tablet Take 10 mg by mouth daily.  06/09/12   [provider]  esomeprazole (NEXIUM) 20 MG capsule Take 20 mg by mouth daily.    [provider]  levothyroxine (SYNTHROID, LEVOTHROID) 100 MCG tablet Take 100 mcg by mouth daily before breakfast.    [provider]  Multiple Vitamin (MULTIVITAMIN WITH MINERALS) TABS tablet Take 1 tablet by mouth daily.    [provider]  polyethylene glycol-electrolytes (TRILYTE) 420 G solution Take 4,000 mLs by mouth as directed. 06/23/12   Rourk, Cristopher Estimable, MD  predniSONE (DELTASONE) 20 MG tablet Take 3 po QD x 3d , then 2  po QD x 3d then 1 po QD x 3d 10/12/17   Rolland Porter, MD    Family History Family History  Problem Relation Age of Onset  . Hypothyroidism Mother   . Varicose Veins Mother   . Heart disease Mother   . Muscular dystrophy Father   . Obesity Sister   . Muscular dystrophy Brother   . Obesity Brother   . Obesity Brother   . Colon cancer Neg Hx     Social History Social History   Tobacco Use  . Smoking status: Former Research scientist (life sciences)  . Smokeless tobacco: Never Used  . Tobacco comment: quit 1979  Substance Use Topics  . Alcohol use: No  . Drug use: No  lives with daughter   Allergies   Ibuprofen   Review of Systems Review of Systems  All other systems  reviewed and are negative.    Physical Exam Updated Vital Signs BP 132/66   Pulse 64   Resp 15   Ht 5' 2.5" (1.588 m)   Wt 52.2 kg (115 lb)   SpO2 96%   BMI 20.70 kg/m   Physical Exam  Constitutional: She is oriented to person, place, and time. She appears well-developed and well-nourished. No distress.  HENT:  Head: Normocephalic and atraumatic.  Right Ear: External ear normal.  Left Ear: External ear normal.  Nose: Nose normal.  Mouth/Throat: Oropharynx is clear and moist.  Patient is noted to have swelling of her right upper lip and her left tongue.  She is able to keep her tongue and her mouth.  Her speech is slightly garbled from the swelling.  However she is not having trouble controlling her secretions.  Eyes: Conjunctivae and EOM are normal. Pupils are equal, round, and reactive to light.  Neck: Normal range of motion.  Cardiovascular: Normal rate, regular rhythm and normal heart sounds.  Pulmonary/Chest: Effort normal and breath sounds normal. No stridor. No respiratory distress. She has no wheezes.  Musculoskeletal: She exhibits no edema.  Neurological: She is alert and oriented to person, place, and time. No cranial nerve deficit.  Skin: Skin is warm and dry.  Psychiatric: She has a normal mood and affect. Her behavior is normal. Thought content normal.  Nursing note and vitals reviewed.        ED Treatments / Results  Labs (all labs ordered are listed, but only abnormal results are displayed) Labs Reviewed - No data to display  EKG  EKG Interpretation None       Radiology No results found.  Procedures .Critical Care Performed by: Rolland Porter, MD Authorized by: Rolland Porter, MD   Critical care provider statement:    Critical care time (minutes):  31   Critical care was necessary to treat or prevent imminent or life-threatening deterioration of the following conditions:  Respiratory failure   Critical care was time spent personally by me on the  following activities:  Examination of patient, obtaining history from patient or surrogate, pulse oximetry and re-evaluation of patient's condition   (including critical care time)  Medications Ordered in ED Medications  EPINEPHrine (ADRENALIN) 1 MG/10ML injection 0.3 mg (not administered)  methylPREDNISolone sodium succinate (SOLU-MEDROL) 125 mg/2 mL injection 125 mg (125 mg Intravenous Given 10/12/17 0020)  diphenhydrAMINE (BENADRYL) injection 25 mg (25 mg Intravenous Given 10/12/17 0024)  famotidine (PEPCID) IVPB 20 mg premix (0 mg Intravenous Stopped 10/12/17 0054)  EPINEPHrine (ADRENALIN) 1 MG/ML (0.3 mg  Given 10/12/17 0055)  diphenhydrAMINE (BENADRYL) injection 25 mg (25 mg Intravenous  Given 10/12/17 0159)     Initial Impression / Assessment and Plan / ED Course  I have reviewed the triage vital signs and the nursing notes.  Pertinent labs & imaging results that were available during my care of the patient were reviewed by me and considered in my medical decision making (see chart for details).     Patient was started on the standard allergic reaction medications, Benadryl, Solu-Medrol, and Pepcid.  Since the EpiPen had worked well earlier in the evening she also received epi 0.3 mg IM.  Recheck at 1:10 AM patient still has some swelling of her right upper lip in her left tongue however she states it feels better and her husband states it looks a lot better.  Her speech is also easier to understand now.  I have given her a low dose of Benadryl on her initial treatment and she received a second dose of Benadryl.  Recheck at 2:50 AM the swelling of her right upper lip is almost totally gone, I am not seeing swelling of her tongue.  Patient states she feels much improved.  She and her husband state they are comfortable going home now.  I am going to give her a prescription for another EpiPen.  She was given information about discount cards for the EpiPen.  Final Clinical Impressions(s) / ED  Diagnoses   Final diagnoses:  Angioedema, initial encounter    ED Discharge Orders        Ordered    EPINEPHrine (EPIPEN 2-PAK) 0.3 mg/0.3 mL IJ SOAJ injection   Once     10/12/17 0259    predniSONE (DELTASONE) 20 MG tablet     10/12/17 0259    OTC pepcid and benadryl  Plan discharge  Rolland Porter, MD, Barbette Or, MD 10/12/17 [phone]

## 2017-10-12 NOTE — Discharge Instructions (Signed)
Take the prednisone until gone with Pepcid OTC twice a day for 7 days. Use benadryl as needed for swelling. Use the epipen if you have swelling again and have difficulty breathing then proceed to the ED.

## 2017-10-12 NOTE — ED Notes (Signed)
Pts

## 2017-10-22 DIAGNOSIS — K219 Gastro-esophageal reflux disease without esophagitis: Secondary | ICD-10-CM | POA: Diagnosis not present

## 2017-10-22 DIAGNOSIS — E782 Mixed hyperlipidemia: Secondary | ICD-10-CM | POA: Diagnosis not present

## 2017-10-22 DIAGNOSIS — Z1389 Encounter for screening for other disorder: Secondary | ICD-10-CM | POA: Diagnosis not present

## 2017-10-22 DIAGNOSIS — Z6822 Body mass index (BMI) 22.0-22.9, adult: Secondary | ICD-10-CM | POA: Diagnosis not present

## 2017-10-22 DIAGNOSIS — Z0001 Encounter for general adult medical examination with abnormal findings: Secondary | ICD-10-CM | POA: Diagnosis not present

## 2017-10-22 DIAGNOSIS — G7100 Muscular dystrophy, unspecified: Secondary | ICD-10-CM | POA: Diagnosis not present

## 2017-10-22 DIAGNOSIS — F329 Major depressive disorder, single episode, unspecified: Secondary | ICD-10-CM | POA: Diagnosis not present

## 2017-11-06 ENCOUNTER — Other Ambulatory Visit (HOSPITAL_COMMUNITY): Payer: Self-pay | Admitting: Family Medicine

## 2017-11-06 DIAGNOSIS — Z1231 Encounter for screening mammogram for malignant neoplasm of breast: Secondary | ICD-10-CM

## 2017-12-02 DIAGNOSIS — Z6822 Body mass index (BMI) 22.0-22.9, adult: Secondary | ICD-10-CM | POA: Diagnosis not present

## 2017-12-02 DIAGNOSIS — E039 Hypothyroidism, unspecified: Secondary | ICD-10-CM | POA: Diagnosis not present

## 2017-12-10 DIAGNOSIS — H5210 Myopia, unspecified eye: Secondary | ICD-10-CM | POA: Diagnosis not present

## 2018-01-13 DIAGNOSIS — E039 Hypothyroidism, unspecified: Secondary | ICD-10-CM | POA: Diagnosis not present

## 2018-01-13 DIAGNOSIS — Z6822 Body mass index (BMI) 22.0-22.9, adult: Secondary | ICD-10-CM | POA: Diagnosis not present

## 2018-01-28 DIAGNOSIS — B078 Other viral warts: Secondary | ICD-10-CM | POA: Diagnosis not present

## 2018-01-28 DIAGNOSIS — D2339 Other benign neoplasm of skin of other parts of face: Secondary | ICD-10-CM | POA: Diagnosis not present

## 2018-01-28 DIAGNOSIS — L729 Follicular cyst of the skin and subcutaneous tissue, unspecified: Secondary | ICD-10-CM | POA: Diagnosis not present

## 2018-03-19 DIAGNOSIS — Z6822 Body mass index (BMI) 22.0-22.9, adult: Secondary | ICD-10-CM | POA: Diagnosis not present

## 2018-03-19 DIAGNOSIS — E039 Hypothyroidism, unspecified: Secondary | ICD-10-CM | POA: Diagnosis not present

## 2018-03-19 DIAGNOSIS — Z1389 Encounter for screening for other disorder: Secondary | ICD-10-CM | POA: Diagnosis not present

## 2018-03-27 DIAGNOSIS — R1310 Dysphagia, unspecified: Secondary | ICD-10-CM | POA: Diagnosis not present

## 2018-03-27 DIAGNOSIS — R471 Dysarthria and anarthria: Secondary | ICD-10-CM | POA: Diagnosis not present

## 2018-03-27 DIAGNOSIS — G7109 Other specified muscular dystrophies: Secondary | ICD-10-CM | POA: Diagnosis not present

## 2018-03-27 DIAGNOSIS — R1312 Dysphagia, oropharyngeal phase: Secondary | ICD-10-CM | POA: Diagnosis not present

## 2018-07-17 DIAGNOSIS — Z6821 Body mass index (BMI) 21.0-21.9, adult: Secondary | ICD-10-CM | POA: Diagnosis not present

## 2018-07-17 DIAGNOSIS — G7100 Muscular dystrophy, unspecified: Secondary | ICD-10-CM | POA: Diagnosis not present

## 2018-07-17 DIAGNOSIS — Z1389 Encounter for screening for other disorder: Secondary | ICD-10-CM | POA: Diagnosis not present

## 2018-07-17 DIAGNOSIS — E039 Hypothyroidism, unspecified: Secondary | ICD-10-CM | POA: Diagnosis not present

## 2018-07-17 DIAGNOSIS — E7841 Elevated Lipoprotein(a): Secondary | ICD-10-CM | POA: Diagnosis not present

## 2018-07-17 DIAGNOSIS — E782 Mixed hyperlipidemia: Secondary | ICD-10-CM | POA: Diagnosis not present

## 2018-08-04 ENCOUNTER — Other Ambulatory Visit (HOSPITAL_COMMUNITY): Payer: Self-pay | Admitting: Family Medicine

## 2018-08-04 DIAGNOSIS — Z1231 Encounter for screening mammogram for malignant neoplasm of breast: Secondary | ICD-10-CM

## 2018-08-18 ENCOUNTER — Ambulatory Visit (HOSPITAL_COMMUNITY)
Admission: RE | Admit: 2018-08-18 | Discharge: 2018-08-18 | Disposition: A | Discharge status: Home / Self Care | Payer: Medicare HMO | Source: Ambulatory Visit | Attending: Family Medicine | Admitting: Family Medicine

## 2018-08-18 ENCOUNTER — Encounter (HOSPITAL_COMMUNITY): Payer: Self-pay

## 2018-08-18 DIAGNOSIS — Z1231 Encounter for screening mammogram for malignant neoplasm of breast: Secondary | ICD-10-CM | POA: Insufficient documentation

## 2018-09-25 DIAGNOSIS — E7849 Other hyperlipidemia: Secondary | ICD-10-CM | POA: Diagnosis not present

## 2018-09-25 DIAGNOSIS — Z6821 Body mass index (BMI) 21.0-21.9, adult: Secondary | ICD-10-CM | POA: Diagnosis not present

## 2018-09-25 DIAGNOSIS — Z1389 Encounter for screening for other disorder: Secondary | ICD-10-CM | POA: Diagnosis not present

## 2018-09-25 DIAGNOSIS — G7100 Muscular dystrophy, unspecified: Secondary | ICD-10-CM | POA: Diagnosis not present

## 2018-09-25 DIAGNOSIS — E039 Hypothyroidism, unspecified: Secondary | ICD-10-CM | POA: Diagnosis not present

## 2018-09-25 DIAGNOSIS — Z0001 Encounter for general adult medical examination with abnormal findings: Secondary | ICD-10-CM | POA: Diagnosis not present

## 2019-02-19 DIAGNOSIS — Z6822 Body mass index (BMI) 22.0-22.9, adult: Secondary | ICD-10-CM | POA: Diagnosis not present

## 2019-02-19 DIAGNOSIS — Z1389 Encounter for screening for other disorder: Secondary | ICD-10-CM | POA: Diagnosis not present

## 2019-02-19 DIAGNOSIS — M79671 Pain in right foot: Secondary | ICD-10-CM | POA: Diagnosis not present

## 2019-02-26 ENCOUNTER — Ambulatory Visit (INDEPENDENT_AMBULATORY_CARE_PROVIDER_SITE_OTHER): Payer: Medicare HMO

## 2019-02-26 ENCOUNTER — Encounter: Payer: Self-pay | Admitting: Podiatry

## 2019-02-26 ENCOUNTER — Ambulatory Visit (INDEPENDENT_AMBULATORY_CARE_PROVIDER_SITE_OTHER): Payer: Medicare HMO | Admitting: Podiatry

## 2019-02-26 ENCOUNTER — Other Ambulatory Visit: Payer: Self-pay

## 2019-02-26 VITALS — Temp 97.9°F

## 2019-02-26 DIAGNOSIS — M21619 Bunion of unspecified foot: Secondary | ICD-10-CM | POA: Diagnosis not present

## 2019-02-26 DIAGNOSIS — M79671 Pain in right foot: Secondary | ICD-10-CM | POA: Diagnosis not present

## 2019-02-26 DIAGNOSIS — M779 Enthesopathy, unspecified: Secondary | ICD-10-CM | POA: Diagnosis not present

## 2019-02-26 DIAGNOSIS — M2042 Other hammer toe(s) (acquired), left foot: Secondary | ICD-10-CM

## 2019-02-26 DIAGNOSIS — M79672 Pain in left foot: Secondary | ICD-10-CM

## 2019-02-26 DIAGNOSIS — M2041 Other hammer toe(s) (acquired), right foot: Secondary | ICD-10-CM

## 2019-03-04 NOTE — Progress Notes (Signed)
Subjective:   Patient ID: [name], female   DOB: 68 y.o.   MRN: [id number]   HPI 68 year old female presents the office with concerns of bilateral foot pain.  She states that her big toes go over her second toes and second toes are contracted.  She has to wear a toe spacer.  She will get bruises on the second toes where they rub.  She previously had bunion surgery on both sides.  This was about 10 years ago with Dr. Berline Lopes.   Review of Systems  All other systems reviewed and are negative.  Past Medical History:  Diagnosis Date  . Broken arm    right  . GERD (gastroesophageal reflux disease)   . Hyperlipidemia   . Hypothyroidism   . Muscular dystrophy ([place])   . Oculopharyngeal muscular dystrophy [facility])     Past Surgical History:  Procedure Laterality Date  . BELPHAROPTOSIS REPAIR Bilateral   . COLONOSCOPY  Oc 2005   normal colonoscopy  . COLONOSCOPY N/A 07/25/2016   Procedure: COLONOSCOPY;  Surgeon: Daneil Dolin, MD;  Location: [facility];  Service: Endoscopy;  Laterality: N/A;  9:15 AM  . pH and manometry   04/28/2002   RMR: Nonspecific motility disorder, DeMeester score 5.3, adequate control of reflux  . throat muscle removed       Current Outpatient Medications:  .  NON FORMULARY, [facility]  Anti-fungal (nail)-#1, Disp: , Rfl:  .  atorvastatin (LIPITOR) 20 MG tablet, Take 20 mg by mouth daily. , Disp: , Rfl:  .  diphenhydrAMINE (BENADRYL) 25 MG tablet, Take 25 mg by mouth at bedtime as needed for sleep., Disp: , Rfl:  .  escitalopram (LEXAPRO) 20 MG tablet, Take 10 mg by mouth daily. , Disp: , Rfl:  .  esomeprazole (NEXIUM) 20 MG capsule, Take 20 mg by mouth daily., Disp: , Rfl:  .  levothyroxine (SYNTHROID, LEVOTHROID) 100 MCG tablet, Take 100 mcg by mouth daily before breakfast., Disp: , Rfl:  .  Multiple Vitamin (MULTIVITAMIN WITH MINERALS) TABS tablet, Take 1 tablet by mouth daily., Disp: , Rfl:   Allergies  Allergen Reactions  . Ibuprofen  Swelling         Objective:  Physical Exam  General: AAO x3, NAD  Dermatological: Skin is warm, dry and supple bilateral. Nails x 10 are well manicured; remaining integument appears unremarkable at this time. There are no open sores, no preulcerative lesions, no rash or signs of infection present.  Vascular: Dorsalis Pedis artery and Posterior Tibial artery pedal pulses are 2/4 bilateral with immedate capillary fill time. Pedal hair growth present. No varicosities and no lower extremity edema present bilateral. There is no pain with calf compression, swelling, warmth, erythema.   Neruologic: Grossly intact via light touch bilateral. Vibratory intact via tuning fork bilateral.  Musculoskeletal: Hallux abductus is present and hallux is going over the second toe causing plantarflexion of the second toe resulting discomfort.  Patient returns with bunion deformity right side.  No pain or crepitation medially to motion.  No pressure hypermobility present.  Muscular strength 5/5 in all groups tested bilateral.  Gait: Unassisted, Nonantalgic.      Assessment:   68 year old female with bilateral hallux abductus, second digital contracture     Plan:  -Treatment options discussed including all alternatives, risks, and complications -Etiology of symptoms were discussed -X-rays were obtained and reviewed with the patient.  Hallux abductus present.  Digital contracture present.  No evidence of acute fracture. -We discussed  both conservative as well as surgical treatment options.  Discussed with her Akin osteotomy and possible revision of the bunion surgery on the right side.  Discussed second MPJ release and repair if needed.  For now we will continue the toe separators.  I will discuss with the group and also will need to get medical clearance for surgery.  Trula Slade DPM

## 2019-04-03 ENCOUNTER — Telehealth: Payer: Self-pay | Admitting: *Deleted

## 2019-04-03 NOTE — Telephone Encounter (Signed)
"  I'm calling for my wife.  I checking back to see if surgery has been scheduled for her.  It's with Dr. Jacqualyn Posey.  Please return my call."  He called again and I asked Ria Comment to let them know we do not have medical clearance yet.

## 2019-04-06 ENCOUNTER — Encounter: Payer: Self-pay | Admitting: *Deleted

## 2019-04-06 ENCOUNTER — Telehealth: Payer: Self-pay | Admitting: Podiatry

## 2019-04-06 DIAGNOSIS — E7849 Other hyperlipidemia: Secondary | ICD-10-CM | POA: Diagnosis not present

## 2019-04-06 DIAGNOSIS — G7100 Muscular dystrophy, unspecified: Secondary | ICD-10-CM | POA: Diagnosis not present

## 2019-04-06 DIAGNOSIS — E039 Hypothyroidism, unspecified: Secondary | ICD-10-CM | POA: Diagnosis not present

## 2019-04-06 NOTE — Telephone Encounter (Signed)
Pt husband called for his wife, wanted to know if we can send another medical clearance for to [facility] for his wife to get sx. I spoke to the sx scheduler and she stated she will send it again.

## 2019-04-06 NOTE — Progress Notes (Signed)
Per Dr. Jacqualyn Posey,  I sent a medical clearance request letter to Dr. Hilma Favors.  I also sent him a history and physical form.

## 2019-04-07 NOTE — Telephone Encounter (Signed)
"  I'm calling in reference to my wife.  She is waiting for surgery.  You all need clearance from [facility].  Could you please call me back regarding this matter?"    I attempted to return his call to inform him we have not received the medical clearance from her doctor at this time.  I could not leave a message because his mailbox has not been set up.  "I received your message that you had called.  I have been trying to get clearance for my wife to have surgery from [facility].  I was just wondering if you have had any success."  I sent a letter requesting the clearance on yesterday.  I am waiting on a response.  "They're saying they haven't received anything.  I know you have sent it.  I'll follow up with them."

## 2019-04-14 ENCOUNTER — Telehealth: Payer: Self-pay | Admitting: *Deleted

## 2019-04-14 DIAGNOSIS — E039 Hypothyroidism, unspecified: Secondary | ICD-10-CM | POA: Diagnosis not present

## 2019-04-14 DIAGNOSIS — Z6821 Body mass index (BMI) 21.0-21.9, adult: Secondary | ICD-10-CM | POA: Diagnosis not present

## 2019-04-14 DIAGNOSIS — E782 Mixed hyperlipidemia: Secondary | ICD-10-CM | POA: Diagnosis not present

## 2019-04-14 DIAGNOSIS — K219 Gastro-esophageal reflux disease without esophagitis: Secondary | ICD-10-CM | POA: Diagnosis not present

## 2019-04-14 DIAGNOSIS — E7849 Other hyperlipidemia: Secondary | ICD-10-CM | POA: Diagnosis not present

## 2019-04-14 DIAGNOSIS — G7100 Muscular dystrophy, unspecified: Secondary | ICD-10-CM | POA: Diagnosis not present

## 2019-04-14 NOTE — Telephone Encounter (Signed)
"  I'm calling to schedule my wife's surgery."  Did she get medical clearance from her primary care physician?  "Yes, she did.  They faxed it to you today.  Do you have it?"  I have not seen it at this time.  Let me check the fax machines.    I found it.  Dr. Jacqualyn Posey does surgeries on Wednesdays.  Do you have a date that she likes?  "We have to go out of town on September 12, so after that date."  Dr.  Jacqualyn Posey can do it on September 23 or 30, 2020.  "Let's schedule it for September 23."  I'll get it scheduled for 04/29/2019.  She will need to see Dr. Jacqualyn Posey prior to her surgery date so she can sign her consent forms.  Would you like me to transfer you to an appointment scheduler so you can schedule that appointment?  "Yes, that will be fine."  I transferred him to [place].  Estill Bamberg scheduled Mrs. Molis an appointment for 04/27/2019.

## 2019-04-23 ENCOUNTER — Telehealth: Payer: Self-pay | Admitting: *Deleted

## 2019-04-23 NOTE — Telephone Encounter (Signed)
DOS 04/29/2019 [name] - ID:[id number] AND [name] [id number]  [employer] MEDICARE: Eligibility Date - Aug 06, 2017   [place] of [place] (Stop [employer]) - Health Benefit Plan Coverage  [place] [employer] (HMO) - Medicare Risk  $3,400.00  Calendar Year  - $45.00  Year to Date  $3,355.00  [place] [employer] (HMO) - Medicare Risk  SURGERY  $295.00  Visit   [place] [employer] (HMO) - Medicare Risk  SURGERY  0 %  Calendar Year   Certificate Information  Reference Number [id number] Status PENDED Review Reason 1 Requires Medical Review Message This case requires further review. You will be contacted if additional information is needed.

## 2019-04-27 ENCOUNTER — Encounter: Payer: Self-pay | Admitting: Podiatry

## 2019-04-27 ENCOUNTER — Other Ambulatory Visit: Payer: Self-pay

## 2019-04-27 ENCOUNTER — Ambulatory Visit (INDEPENDENT_AMBULATORY_CARE_PROVIDER_SITE_OTHER): Payer: Medicare HMO | Admitting: Podiatry

## 2019-04-27 DIAGNOSIS — M2042 Other hammer toe(s) (acquired), left foot: Secondary | ICD-10-CM | POA: Diagnosis not present

## 2019-04-27 DIAGNOSIS — M779 Enthesopathy, unspecified: Secondary | ICD-10-CM

## 2019-04-27 DIAGNOSIS — M2041 Other hammer toe(s) (acquired), right foot: Secondary | ICD-10-CM | POA: Diagnosis not present

## 2019-04-27 DIAGNOSIS — M21619 Bunion of unspecified foot: Secondary | ICD-10-CM | POA: Diagnosis not present

## 2019-04-27 NOTE — Telephone Encounter (Signed)
[employer] approved the surgery for cpt codes 4021827029 and 28310.  The authorization number is [id number] .  It is valid from 04/29/2019 to 06/13/2019.     Service Information  Service Type 2 - Surgical Place of Service [place] Service From - To Date 2019-04-27 - 2019-05-29 Quantity 1 Visits  Diagnosis Code 1 [id number] - Other hammer toe(s) (acquired) unspecified foot Procedure Code 1 (CPT/HCPCS) 28270 Quantity 1 Units Procedure From - To Date [id number]  Certificate Information  Certification Number [id number] Status CERTIFIED IN TOTAL Message Authorization is based on information provided; it is not a guarantee of payment. Billed services are subject to medical necessity, appropriate setting, billing/coding, plan limits, eligibility at time of service. Verify benefits online or call Customer Service.

## 2019-04-27 NOTE — Patient Instructions (Signed)
Pre-Operative Instructions  Congratulations, you have decided to take an important step towards improving your quality of life.  You can be assured that the doctors and staff at [facility] will be with you every step of the way.  Here are some important things you should know:  1. Plan to be at the surgery center/hospital at least 1 (one) hour prior to your scheduled time, unless otherwise directed by the surgical center/hospital staff.  You must have a responsible adult accompany you, remain during the surgery and drive you home.  Make sure you have directions to the surgical center/hospital to ensure you arrive on time. 2. If you are having surgery at [facility] or [facility] hospitals, you will need a copy of your medical history and physical form from your family physician within one month prior to the date of surgery. We will give you a form for your primary physician to complete.  3. We make every effort to accommodate the date you request for surgery.  However, there are times where surgery dates or times have to be moved.  We will contact you as soon as possible if a change in schedule is required.   4. No aspirin/ibuprofen for one week before surgery.  If you are on aspirin, any non-steroidal anti-inflammatory medications (Mobic, Aleve, Ibuprofen) should not be taken seven (7) days prior to your surgery.  You make take Tylenol for pain prior to surgery.  5. Medications - If you are taking daily heart and blood pressure medications, seizure, reflux, allergy, asthma, anxiety, pain or diabetes medications, make sure you notify the surgery center/hospital before the day of surgery so they can tell you which medications you should take or avoid the day of surgery. 6. No food or drink after midnight the night before surgery unless directed otherwise by surgical center/hospital staff. 7. No alcoholic beverages 24-hours prior to surgery.  No smoking 24-hours prior or 24-hours after  surgery. 8. Wear loose pants or shorts. They should be loose enough to fit over bandages, boots, and casts. 9. Don't wear slip-on shoes. Sneakers are preferred. 10. Bring your boot with you to the surgery center/hospital.  Also bring crutches or a walker if your physician has prescribed it for you.  If you do not have this equipment, it will be provided for you after surgery. 11. If you have not been contacted by the surgery center/hospital by the day before your surgery, call to confirm the date and time of your surgery. 12. Leave-time from work may vary depending on the type of surgery you have.  Appropriate arrangements should be made prior to surgery with your employer. 13. Prescriptions will be provided immediately following surgery by your doctor.  Fill these as soon as possible after surgery and take the medication as directed. Pain medications will not be refilled on weekends and must be approved by the doctor. 14. Remove nail polish on the operative foot and avoid getting pedicures prior to surgery. 15. Wash the night before surgery.  The night before surgery wash the foot and leg well with water and the antibacterial soap provided. Be sure to pay special attention to beneath the toenails and in between the toes.  Wash for at least three (3) minutes. Rinse thoroughly with water and dry well with a towel.  Perform this wash unless told not to do so by your physician.  Enclosed: 1 Ice pack (please put in freezer the night before surgery)   1 Hibiclens skin cleaner     Pre-op instructions  If you have any questions regarding the instructions, please do not hesitate to call our office.  [place]: [address], [place], [place] [address] -- [phone]  [place]: [address] [place], [place] [address] -- [phone]  [place]: [address]  [place], [place] [address] -- [phone]   Website: [email or web address] 

## 2019-04-28 DIAGNOSIS — R413 Other amnesia: Secondary | ICD-10-CM | POA: Diagnosis not present

## 2019-04-28 DIAGNOSIS — R1313 Dysphagia, pharyngeal phase: Secondary | ICD-10-CM | POA: Diagnosis not present

## 2019-04-28 DIAGNOSIS — R1312 Dysphagia, oropharyngeal phase: Secondary | ICD-10-CM | POA: Diagnosis not present

## 2019-04-28 DIAGNOSIS — G7109 Other specified muscular dystrophies: Secondary | ICD-10-CM | POA: Diagnosis not present

## 2019-04-28 DIAGNOSIS — K219 Gastro-esophageal reflux disease without esophagitis: Secondary | ICD-10-CM | POA: Diagnosis not present

## 2019-04-28 NOTE — Progress Notes (Signed)
Subjective: 68 year old female presents the office today for surgical consultation.  She states that she gets pain to both of her feet and recently is in the right side hurting more than left.  She is scheduled for surgery on Wednesday transversely the right foot. She states that her second toes become contracted and her big toe is overlapping the big toe causing irritation.  She is tried shoe modifications, offloading with a significant appointment.Denies any systemic complaints such as fevers, chills, nausea, vomiting. No acute changes since last appointment, and no other complaints at this time.   Objective: AAO x3, NAD DP/PT pulses palpable bilaterally, CRT less than 3 seconds-denies any claudication symptoms.  Pedal hair present. Bunion deformity with hallux abductus present overlapping second toe with the right side worse than left.  Second toe is significantly plantarflexed position hammertoe contractures present.  No areas of discomfort. No open lesions or pre-ulcerative lesions.  No pain with calf compression, swelling, warmth, erythema  Assessment: Bilateral bunion deformity with second hammertoe contracture  Plan: -All treatment options discussed with the patient including all alternatives, risks, complications.  -I reviewed the x-rays with her and her husband today.  We discussed treatment options both surgical as well as conservative.  She wants to proceed with surgery. -On the right foot is where she had majority discomfort she was proceed with surgery the right foot.  We discussed revision bunion surgery including [place], Akin bunionectomy with hammertoe repair of the second toe with pinning.  I discussed the procedure of the postoperative course with her and her husband. They both want to proceed with surgery as scheduled on Wednesday -The incision placement as well as the postoperative course was discussed with the patient. I discussed risks of the surgery which include, but not  limited to, infection, bleeding, pain, swelling, need for further surgery, delayed or nonhealing, painful or ugly scar, numbness or sensation changes, over/under correction, recurrence, transfer lesions, further deformity, hardware failure, DVT/PE, loss of toe/foot. Patient understands these risks and wishes to proceed with surgery. The surgical consent was reviewed with the patient all 3 pages were signed. No promises or guarantees were given to the outcome of the procedure. All questions were answered to the best of my ability. Before the surgery the patient was encouraged to call the office if there is any further questions. The surgery will be performed at the [facility] on an outpatient basis. -Patient encouraged to call the office with any questions, concerns, change in symptoms.   Trula Slade DPM

## 2019-04-29 ENCOUNTER — Other Ambulatory Visit: Payer: Self-pay | Admitting: Podiatry

## 2019-04-29 ENCOUNTER — Encounter: Payer: Self-pay | Admitting: Podiatry

## 2019-04-29 DIAGNOSIS — M2011 Hallux valgus (acquired), right foot: Secondary | ICD-10-CM | POA: Diagnosis not present

## 2019-04-29 DIAGNOSIS — M21611 Bunion of right foot: Secondary | ICD-10-CM | POA: Diagnosis not present

## 2019-04-29 DIAGNOSIS — K219 Gastro-esophageal reflux disease without esophagitis: Secondary | ICD-10-CM | POA: Diagnosis not present

## 2019-04-29 DIAGNOSIS — M2041 Other hammer toe(s) (acquired), right foot: Secondary | ICD-10-CM | POA: Diagnosis not present

## 2019-04-29 DIAGNOSIS — M25571 Pain in right ankle and joints of right foot: Secondary | ICD-10-CM | POA: Diagnosis not present

## 2019-04-29 MED ORDER — PROMETHAZINE HCL 25 MG PO TABS: 25.0000 mg | ORAL_TABLET | Freq: Three times a day (TID) | ORAL | 0 refills | Status: DC | PRN

## 2019-04-29 MED ORDER — CEPHALEXIN 500 MG PO CAPS: 500.0000 mg | ORAL_CAPSULE | Freq: Three times a day (TID) | ORAL | 0 refills | Status: DC

## 2019-04-29 MED ORDER — HYDROCODONE-ACETAMINOPHEN 5-325 MG PO TABS: 1.0000 | ORAL_TABLET | Freq: Four times a day (QID) | ORAL | 0 refills | Status: DC | PRN

## 2019-04-29 NOTE — Progress Notes (Signed)
Post op medications sent to pharmacy 

## 2019-05-01 ENCOUNTER — Telehealth: Payer: Self-pay | Admitting: *Deleted

## 2019-05-01 NOTE — Telephone Encounter (Signed)
Called and spoke with the patient and the patient is doing ok and the pain scale is about a 3 and the block is wearing off and took the pain medication at 11:00 am and there is no fever,no chills, no nausea and is icing and elevating and I stated to call the office if any concerns or questions. Lattie Haw

## 2019-05-05 ENCOUNTER — Ambulatory Visit (INDEPENDENT_AMBULATORY_CARE_PROVIDER_SITE_OTHER): Payer: Medicare HMO | Admitting: Podiatry

## 2019-05-05 ENCOUNTER — Encounter: Payer: Self-pay | Admitting: Podiatry

## 2019-05-05 ENCOUNTER — Other Ambulatory Visit: Payer: Self-pay

## 2019-05-05 ENCOUNTER — Ambulatory Visit (INDEPENDENT_AMBULATORY_CARE_PROVIDER_SITE_OTHER): Payer: Medicare HMO

## 2019-05-05 VITALS — BP 129/81 | Temp 97.5°F

## 2019-05-05 DIAGNOSIS — M21619 Bunion of unspecified foot: Secondary | ICD-10-CM

## 2019-05-05 DIAGNOSIS — M2041 Other hammer toe(s) (acquired), right foot: Secondary | ICD-10-CM | POA: Diagnosis not present

## 2019-05-05 DIAGNOSIS — M2042 Other hammer toe(s) (acquired), left foot: Secondary | ICD-10-CM

## 2019-05-05 NOTE — Progress Notes (Signed)
Subjective: [name] is a 68 y.o. is seen today in office s/p right foot Liane Comber, Akin bunionectomy with second digit hammertoe repair preformed on 04/29/2019.  Overall he states that she is doing well.  She is taken pain medicine occasionally but otherwise much improved.  She still in a surgical boot.  She has no new concerns.  She is with her husband today. Denies any systemic complaints such as fevers, chills, nausea, vomiting. No calf pain, chest pain, shortness of breath.   Objective: General: No acute distress, AAOx3  DP/PT pulses palpable 2/4, CRT < 3 sec to all digits.  Protective sensation intact. Motor function intact.  RIGHT foot: Incision is well coapted without any evidence of dehiscence and sutures are intact. There is minimal surrounding erythema but this is more from swelling as opposed to infection.  Is no drainage or pus or any ascending cellulitis or any increase in warmth.  K wire intact in the second toe without any drainage.  The toes are in rectus position. No other open lesions or pre-ulcerative lesions.  No pain with calf compression, swelling, warmth, erythema.   Assessment and Plan:  Status post right foot surgery, doing well with no complications   -Treatment options discussed including all alternatives, risks, and complications -X-rays.  No evidence of acute fracture.  Hardware intact. -Antibiotic ointment and a bandage was applied.  Keep dressing clean, dry, intact.  Toe splinted to hold in a rectus position on the bunion. -Continue offloading boot.  Elevation.  Discussed activity level should be minimized to only around the house and short distances and keep foot elevated but I would like for her to work on range of motion help with circulation. -Ice/elevation -Pain medication as needed. -Monitor for any clinical signs or symptoms of infection and DVT/PE and directed to call the office immediately should any occur or go to the ER. -Follow-up as scheduled or  sooner if any problems arise. In the meantime, encouraged to call the office with any questions, concerns, change in symptoms.   Celesta Gentile, DPM

## 2019-05-06 DIAGNOSIS — E782 Mixed hyperlipidemia: Secondary | ICD-10-CM | POA: Diagnosis not present

## 2019-05-06 DIAGNOSIS — F329 Major depressive disorder, single episode, unspecified: Secondary | ICD-10-CM | POA: Diagnosis not present

## 2019-05-06 DIAGNOSIS — E039 Hypothyroidism, unspecified: Secondary | ICD-10-CM | POA: Diagnosis not present

## 2019-05-12 ENCOUNTER — Ambulatory Visit (INDEPENDENT_AMBULATORY_CARE_PROVIDER_SITE_OTHER): Payer: Self-pay | Admitting: Podiatry

## 2019-05-12 ENCOUNTER — Other Ambulatory Visit: Payer: Self-pay

## 2019-05-12 DIAGNOSIS — M2042 Other hammer toe(s) (acquired), left foot: Secondary | ICD-10-CM

## 2019-05-12 DIAGNOSIS — M2041 Other hammer toe(s) (acquired), right foot: Secondary | ICD-10-CM

## 2019-05-12 DIAGNOSIS — M21619 Bunion of unspecified foot: Secondary | ICD-10-CM

## 2019-05-12 MED ORDER — HYDROCODONE-ACETAMINOPHEN 5-325 MG PO TABS: 1.0000 | ORAL_TABLET | Freq: Four times a day (QID) | ORAL | 0 refills | Status: DC | PRN

## 2019-05-14 NOTE — Progress Notes (Signed)
Subjective: [name] is a 68 y.o. is seen today in office s/p right foot Liane Comber, Akin bunionectomy with second digit hammertoe repair preformed on 04/29/2019.  She states that she is some occasional sharp pains but overall improving.  Still wearing a surgical boot. Denies any systemic complaints such as fevers, chills, nausea, vomiting. No calf pain, chest pain, shortness of breath.   Objective: General: No acute distress, AAOx3  DP/PT pulses palpable 2/4, CRT < 3 sec to all digits.  Protective sensation intact. Motor function intact.  RIGHT foot: Incision is well coapted without any evidence of dehiscence and sutures are intact.  The toes are in rectus position.  There is no surrounding erythema, ascending cellulitis.  No drainage or pus or any clinical signs of infection.  Minimal tenderness palpation at surgical site.  K wire intact of the second toe. No other open lesions or pre-ulcerative lesions.  No pain with calf compression, swelling, warmth, erythema.   Assessment and Plan:  Status post right foot surgery, doing well with no complications   -Treatment options discussed including all alternatives, risks, and complications -Incision appears to be healing well.  Out the sutures intact today as there is still some motion across the incision.  Antibiotic ointment and a dressing was applied.  Keep dressing clean, dry, intact. -Remain in cam boot. -Continue to ice and elevate. -Pain medication as needed. -Monitor for any clinical signs or symptoms of infection and DVT/PE and directed to call the office immediately should any occur or go to the ER. -Follow-up as scheduled or sooner if any problems arise. In the meantime, encouraged to call the office with any questions, concerns, change in symptoms.   *Next appointment possible suture removal.  Upon bandaging please splint the hallux in a rectus position.  Once the stitches are all out she can also go into a Darco splint for Bioskin to  help splint the hallux.  Celesta Gentile, DPM

## 2019-05-19 ENCOUNTER — Other Ambulatory Visit: Payer: Self-pay

## 2019-05-19 ENCOUNTER — Ambulatory Visit (INDEPENDENT_AMBULATORY_CARE_PROVIDER_SITE_OTHER): Payer: Self-pay | Admitting: Podiatry

## 2019-05-19 DIAGNOSIS — M21619 Bunion of unspecified foot: Secondary | ICD-10-CM

## 2019-05-19 DIAGNOSIS — M2041 Other hammer toe(s) (acquired), right foot: Secondary | ICD-10-CM

## 2019-05-19 DIAGNOSIS — M2042 Other hammer toe(s) (acquired), left foot: Secondary | ICD-10-CM

## 2019-05-19 DIAGNOSIS — Z09 Encounter for follow-up examination after completed treatment for conditions other than malignant neoplasm: Secondary | ICD-10-CM

## 2019-05-20 ENCOUNTER — Encounter: Payer: Self-pay | Admitting: Podiatry

## 2019-05-20 NOTE — Progress Notes (Signed)
  Subjective:  Patient ID: [name], female    DOB: September 06, 1950,  MRN: [id number]  Chief Complaint  Patient presents with  . Foot Pain    SUTURE REMOVAL DOS 04/29/2019 DOUBLE OSTEOTOMY AND HAMMER TOE REPAIR 2ND RT    DOS: 04/29/2019 Procedure: Right foot Noreene Larsson bunionectomy with second digit hammertoe repair  68 y.o. female returns for post-op check.  She is doing well no acute complaints.  Her pain is well controlled.  She is still in a surgical boot.  Her sutures are intact.  Today she is presenting for suture removal.  Review of Systems: Negative except as noted in the HPI. Denies N/V/F/Ch.  Past Medical History:  Diagnosis Date  . Broken arm    right  . GERD (gastroesophageal reflux disease)   . Hyperlipidemia   . Hypothyroidism   . Muscular dystrophy ([place])   . 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[place])     Current Outpatient Medications:  .  atorvastatin (LIPITOR) 20 MG tablet, Take 20 mg by mouth daily. , Disp: , Rfl:  .  cephALEXin (KEFLEX) 500 MG capsule, Take 1 capsule (500 mg total) by mouth 3 (three) times daily., Disp: 21 capsule, Rfl: 0 .  diphenhydrAMINE (BENADRYL) 25 MG tablet, Take 25 mg by mouth at bedtime as needed for sleep., Disp: , Rfl:  .  escitalopram (LEXAPRO) 20 MG tablet, Take 10 mg by mouth daily. , Disp: , Rfl:  .  esomeprazole (NEXIUM) 20 MG capsule, Take 20 mg by mouth daily., Disp: , Rfl:  .  HYDROcodone-acetaminophen (NORCO/VICODIN) 5-325 MG tablet, Take 1 tablet by mouth every 6 (six) hours as needed., Disp: 20 tablet, Rfl: 0 .  levothyroxine (SYNTHROID, LEVOTHROID) 100 MCG tablet, Take 100 mcg by mouth daily before breakfast., Disp: , Rfl:  .  Multiple Vitamin (MULTIVITAMIN WITH MINERALS) TABS tablet, Take 1 tablet by mouth daily., Disp: , Rfl:  .  NON FORMULARY, [facility]  Anti-fungal (nail)-#1, Disp: , Rfl:  .  promethazine (PHENERGAN) 25 MG tablet, Take 1 tablet (25 mg total) by mouth every 8 (eight) hours as needed for  nausea or vomiting., Disp: 20 tablet, Rfl: 0  Social History   Tobacco Use  Smoking Status Former Smoker  Smokeless Tobacco Never Used  Tobacco Comment   quit 1979    Allergies  Allergen Reactions  . Ibuprofen Swelling   Objective:  There were no vitals filed for this visit. There is no height or weight on file to calculate BMI. Constitutional Well developed. Well nourished.  Vascular Foot warm and well perfused. Capillary refill normal to all digits.   Neurologic Normal speech. Oriented to person, place, and time. Epicritic sensation to light touch grossly present bilaterally.  Dermatologic Skin healing well without signs of infection. Skin edges well coapted without signs of infection.  Orthopedic: Tenderness to palpation noted about the surgical site.   Radiographs: None Assessment:   1. Hammertoes of both feet   2. Bunion   3. Postop check    Plan:  Patient was evaluated and treated and all questions answered.  S/p foot surgery right -Progressing as expected post-operatively. -XR: None -WB Status: Weightbearing as tolerated in cam boot -Sutures: Sutures were removed today no clinical signs of infection noted.. -Medications: None -Foot redressed.  Return in about 2 weeks (around 06/02/2019).

## 2019-06-02 ENCOUNTER — Other Ambulatory Visit: Payer: Self-pay

## 2019-06-02 ENCOUNTER — Ambulatory Visit (INDEPENDENT_AMBULATORY_CARE_PROVIDER_SITE_OTHER): Payer: Medicare HMO

## 2019-06-02 ENCOUNTER — Ambulatory Visit (INDEPENDENT_AMBULATORY_CARE_PROVIDER_SITE_OTHER): Payer: Medicare HMO | Admitting: Podiatry

## 2019-06-02 DIAGNOSIS — M21611 Bunion of right foot: Secondary | ICD-10-CM

## 2019-06-02 DIAGNOSIS — M21619 Bunion of unspecified foot: Secondary | ICD-10-CM

## 2019-06-02 NOTE — Progress Notes (Signed)
Subjective: [name] is a 68 y.o. is seen today in office s/p right foot Liane Comber, Akin bunionectomy with second digit hammertoe repair preformed on 04/29/2019.  She states that she is doing well.  Her wire did come out about 5 days ago.  Denies any systemic complaints such as fevers, chills, nausea, vomiting. No calf pain, chest pain, shortness of breath.   Objective: General: No acute distress, AAOx3  DP/PT pulses palpable 2/4, CRT < 3 sec to all digits.  Protective sensation intact. Motor function intact.  RIGHT foot: Incision is well coapted without any evidence of dehiscence and a scar has formed. The hallux is mildly overlapping the 2nd toe but much better compared to prior to surgery. No pain to the surgical sites.  No other open lesions or pre-ulcerative lesions.  No pain with calf compression, swelling, warmth, erythema.   Assessment and Plan:  Status post right foot surgery, doing well with no complications   -Treatment options discussed including all alternatives, risks, and complications -X-rays were obtained reviewed.  Osteotomy site still evident and hardware intact. -Pulmonary remains in surgical boot.  I dispensed a Darco splint for her bunion.  Use ice elevate.  We discussed activity.  She has been outside in the yard working and on her foot more but wearing the cam boot.  I want her to hold off on being outside as much but gradually increase activity otherwise.  Work you at all times except while sleeping.  Trula Slade DPM

## 2019-06-16 ENCOUNTER — Ambulatory Visit (INDEPENDENT_AMBULATORY_CARE_PROVIDER_SITE_OTHER): Payer: Medicare HMO | Admitting: Podiatry

## 2019-06-16 ENCOUNTER — Other Ambulatory Visit: Payer: Self-pay

## 2019-06-16 ENCOUNTER — Ambulatory Visit (INDEPENDENT_AMBULATORY_CARE_PROVIDER_SITE_OTHER): Payer: Medicare HMO

## 2019-06-16 ENCOUNTER — Encounter: Payer: Self-pay | Admitting: Podiatry

## 2019-06-16 DIAGNOSIS — M2011 Hallux valgus (acquired), right foot: Secondary | ICD-10-CM

## 2019-06-16 DIAGNOSIS — M2041 Other hammer toe(s) (acquired), right foot: Secondary | ICD-10-CM | POA: Diagnosis not present

## 2019-06-16 DIAGNOSIS — Z9889 Other specified postprocedural states: Secondary | ICD-10-CM

## 2019-06-23 NOTE — Progress Notes (Signed)
Subjective: [name] is a 68 y.o. is seen today in office s/p right foot Liane Comber, Akin bunionectomy with second digit hammertoe repair preformed on 04/29/2019.  She states that she is doing well however the Darco splint is uncomfortable for her and she has difficulty putting this on.  She still gets some swelling to the foot that she is concerned about.  She denies any redness or warmth.  She has no fevers, chills, nausea, vomiting.  Denies any calf pain, chest pain, shortness of breath.  Objective: General: No acute distress, AAOx3  DP/PT pulses palpable 2/4, CRT < 3 sec to all digits.  Protective sensation intact. Motor function intact.  RIGHT foot: Incision is well coapted without any evidence of dehiscence and a scar has formed. The hallux is mildly overlapping the 2nd toe but much better compared to prior to surgery.  This appears to be unchanged.  There is still mild swelling to the foot there is no erythema or warmth.  There is no drainage or possibly clinical signs of infection.  No pain to the surgical sites.  No other open lesions or pre-ulcerative lesions.  No pain with calf compression, swelling, warmth, erythema.   Assessment and Plan:  Status post right foot surgery, doing well with no complications   -Treatment options discussed including all alternatives, risks, and complications -X-rays were obtained reviewed.  There is increased consolidation across the osteotomy site.  Hardware intact. -I dispensed a BioSkin for her to help splint the toe.  If not able to wear this discussed a toe separator which I also dispensed.  For now remain in the Darco shoe however as she starts to feel better and the swelling improves she can transition to regular shoe as tolerated.  Continue ice elevate. -Monitor for any clinical signs or symptoms of infection and directed to call the office immediately should any occur or go to the ER.  Return in about 3 weeks (around 07/07/2019). X-ray next  appointment   Trula Slade DPM

## 2019-07-07 ENCOUNTER — Other Ambulatory Visit: Payer: Self-pay

## 2019-07-07 ENCOUNTER — Ambulatory Visit (INDEPENDENT_AMBULATORY_CARE_PROVIDER_SITE_OTHER): Payer: Medicare HMO | Admitting: Podiatry

## 2019-07-07 ENCOUNTER — Ambulatory Visit (INDEPENDENT_AMBULATORY_CARE_PROVIDER_SITE_OTHER): Payer: Medicare HMO

## 2019-07-07 ENCOUNTER — Encounter: Payer: Self-pay | Admitting: Podiatry

## 2019-07-07 DIAGNOSIS — M2011 Hallux valgus (acquired), right foot: Secondary | ICD-10-CM

## 2019-07-07 MED ORDER — CICLOPIROX 8 % EX SOLN: Freq: Every day | CUTANEOUS | 2 refills | Status: DC

## 2019-07-07 NOTE — Progress Notes (Signed)
Subjective: [name] is a 68 y.o. is seen today in office s/p right foot Liane Comber, Akin bunionectomy with second digit hammertoe repair preformed on 04/29/2019.  She states that she is doing well however the splints cause irritation to the toes.  Other than that she is having no issues.  She is wearing a regular shoe today.  No pain.  No pain medication.  Some central swelling at times but overall improving.  She has no other concerns today. Denies any fevers, chills, nausea, vomiting.  Denies any calf pain, chest pain, shortness of breath.  Objective: General: No acute distress, AAOx3  DP/PT pulses palpable 2/4, CRT < 3 sec to all digits.  Protective sensation intact. Motor function intact.  RIGHT foot: Incision is well coapted without any evidence of dehiscence and a scar has formed.  On nonweightbearing exam there is still hallux abductus present however upon weightbearing exam the toe does straighten out and clinically is much straighter.  There is no skin breakdown interdigitally.  The brace has been causing irritation but no breakdown or signs of infection today.  No pain with MPJ range of motion. No other open lesions or pre-ulcerative lesions.  No pain with calf compression, swelling, warmth, erythema.   Assessment and Plan:  Status post right foot surgery, doing well with no complications   -Treatment options discussed including all alternatives, risks, and complications -X-rays were obtained reviewed.  There is increased consolidation across the osteotomy site.  Hardware intact. -As she is doing much better regarding use a toe separator instead of the splints to help hold the toe in a rectus position.  Although on nonweightbearing exam the toe is still overlapping when she puts weight on the foot it does straighten out quite a bit.  She is wearing a regular shoe without any significant pain.  Continue with supportive shoes and continue range of motion exercises.  At this point since she  is doing well after surgery and postoperative.  She agrees with this plan has no further questions or concerns.  Discussed there is no changes to let me know.  I will be happy to see her back as needed.  Return if symptoms worsen or fail to improve.  Trula Slade DPM

## 2019-07-15 DIAGNOSIS — M2011 Hallux valgus (acquired), right foot: Secondary | ICD-10-CM | POA: Insufficient documentation

## 2019-07-24 DIAGNOSIS — H5200 Hypermetropia, unspecified eye: Secondary | ICD-10-CM | POA: Diagnosis not present

## 2019-07-24 DIAGNOSIS — Z0100 Encounter for examination of eyes and vision without abnormal findings: Secondary | ICD-10-CM | POA: Diagnosis not present

## 2019-08-06 DIAGNOSIS — F329 Major depressive disorder, single episode, unspecified: Secondary | ICD-10-CM | POA: Diagnosis not present

## 2019-08-06 DIAGNOSIS — E7849 Other hyperlipidemia: Secondary | ICD-10-CM | POA: Diagnosis not present

## 2019-08-12 ENCOUNTER — Other Ambulatory Visit: Payer: Self-pay

## 2019-08-12 ENCOUNTER — Ambulatory Visit: Payer: Medicare HMO | Attending: Internal Medicine

## 2019-08-12 DIAGNOSIS — Z20822 Contact with and (suspected) exposure to covid-19: Secondary | ICD-10-CM | POA: Diagnosis not present

## 2019-08-13 LAB — NOVEL CORONAVIRUS, NAA: SARS-CoV-2, NAA: NOT DETECTED

## 2019-08-14 ENCOUNTER — Telehealth: Payer: Self-pay | Admitting: *Deleted

## 2019-08-14 NOTE — Telephone Encounter (Signed)
Pt given result of COVID test; she verbalized understanding. 

## 2019-09-11 ENCOUNTER — Other Ambulatory Visit (HOSPITAL_COMMUNITY): Payer: Self-pay | Admitting: Family Medicine

## 2019-09-11 DIAGNOSIS — Z1231 Encounter for screening mammogram for malignant neoplasm of breast: Secondary | ICD-10-CM

## 2019-09-14 ENCOUNTER — Ambulatory Visit (HOSPITAL_COMMUNITY)
Admission: RE | Admit: 2019-09-14 | Discharge: 2019-09-14 | Disposition: A | Discharge status: Home / Self Care | Payer: Medicare HMO | Source: Ambulatory Visit | Attending: Family Medicine | Admitting: Family Medicine

## 2019-09-14 ENCOUNTER — Other Ambulatory Visit: Payer: Self-pay

## 2019-09-14 DIAGNOSIS — Z1231 Encounter for screening mammogram for malignant neoplasm of breast: Secondary | ICD-10-CM | POA: Diagnosis not present

## 2019-10-04 DIAGNOSIS — E7849 Other hyperlipidemia: Secondary | ICD-10-CM | POA: Diagnosis not present

## 2019-10-04 DIAGNOSIS — F329 Major depressive disorder, single episode, unspecified: Secondary | ICD-10-CM | POA: Diagnosis not present

## 2019-10-30 DIAGNOSIS — Z1389 Encounter for screening for other disorder: Secondary | ICD-10-CM | POA: Diagnosis not present

## 2019-10-30 DIAGNOSIS — E782 Mixed hyperlipidemia: Secondary | ICD-10-CM | POA: Diagnosis not present

## 2019-10-30 DIAGNOSIS — E7849 Other hyperlipidemia: Secondary | ICD-10-CM | POA: Diagnosis not present

## 2019-10-30 DIAGNOSIS — K219 Gastro-esophageal reflux disease without esophagitis: Secondary | ICD-10-CM | POA: Diagnosis not present

## 2019-10-30 DIAGNOSIS — G7100 Muscular dystrophy, unspecified: Secondary | ICD-10-CM | POA: Diagnosis not present

## 2019-10-30 DIAGNOSIS — Z0000 Encounter for general adult medical examination without abnormal findings: Secondary | ICD-10-CM | POA: Diagnosis not present

## 2019-10-30 DIAGNOSIS — Z6821 Body mass index (BMI) 21.0-21.9, adult: Secondary | ICD-10-CM | POA: Diagnosis not present

## 2019-10-30 DIAGNOSIS — F329 Major depressive disorder, single episode, unspecified: Secondary | ICD-10-CM | POA: Diagnosis not present

## 2019-10-30 DIAGNOSIS — E039 Hypothyroidism, unspecified: Secondary | ICD-10-CM | POA: Diagnosis not present

## 2019-11-04 DIAGNOSIS — E039 Hypothyroidism, unspecified: Secondary | ICD-10-CM | POA: Diagnosis not present

## 2019-11-04 DIAGNOSIS — F329 Major depressive disorder, single episode, unspecified: Secondary | ICD-10-CM | POA: Diagnosis not present

## 2019-11-04 DIAGNOSIS — E7849 Other hyperlipidemia: Secondary | ICD-10-CM | POA: Diagnosis not present

## 2019-12-04 DIAGNOSIS — K219 Gastro-esophageal reflux disease without esophagitis: Secondary | ICD-10-CM | POA: Diagnosis not present

## 2019-12-04 DIAGNOSIS — E039 Hypothyroidism, unspecified: Secondary | ICD-10-CM | POA: Diagnosis not present

## 2019-12-04 DIAGNOSIS — E7849 Other hyperlipidemia: Secondary | ICD-10-CM | POA: Diagnosis not present

## 2019-12-23 DIAGNOSIS — E039 Hypothyroidism, unspecified: Secondary | ICD-10-CM | POA: Diagnosis not present

## 2019-12-23 DIAGNOSIS — Z6821 Body mass index (BMI) 21.0-21.9, adult: Secondary | ICD-10-CM | POA: Diagnosis not present

## 2020-01-06 DIAGNOSIS — H53461 Homonymous bilateral field defects, right side: Secondary | ICD-10-CM | POA: Diagnosis not present

## 2020-01-06 DIAGNOSIS — G7109 Other specified muscular dystrophies: Secondary | ICD-10-CM | POA: Diagnosis not present

## 2020-01-06 DIAGNOSIS — H02423 Myogenic ptosis of bilateral eyelids: Secondary | ICD-10-CM | POA: Diagnosis not present

## 2020-02-04 DIAGNOSIS — H02423 Myogenic ptosis of bilateral eyelids: Secondary | ICD-10-CM | POA: Diagnosis not present

## 2020-02-11 DIAGNOSIS — E039 Hypothyroidism, unspecified: Secondary | ICD-10-CM | POA: Diagnosis not present

## 2020-02-11 DIAGNOSIS — G7109 Other specified muscular dystrophies: Secondary | ICD-10-CM | POA: Diagnosis not present

## 2020-02-11 DIAGNOSIS — H02423 Myogenic ptosis of bilateral eyelids: Secondary | ICD-10-CM | POA: Diagnosis not present

## 2020-03-07 DIAGNOSIS — M85852 Other specified disorders of bone density and structure, left thigh: Secondary | ICD-10-CM | POA: Diagnosis not present

## 2020-03-07 DIAGNOSIS — E2839 Other primary ovarian failure: Secondary | ICD-10-CM | POA: Diagnosis not present

## 2020-04-12 DIAGNOSIS — R1310 Dysphagia, unspecified: Secondary | ICD-10-CM | POA: Diagnosis not present

## 2020-04-12 DIAGNOSIS — G7109 Other specified muscular dystrophies: Secondary | ICD-10-CM | POA: Diagnosis not present

## 2020-04-12 DIAGNOSIS — R471 Dysarthria and anarthria: Secondary | ICD-10-CM | POA: Diagnosis not present

## 2020-04-12 DIAGNOSIS — R1313 Dysphagia, pharyngeal phase: Secondary | ICD-10-CM | POA: Diagnosis not present

## 2020-04-12 DIAGNOSIS — R413 Other amnesia: Secondary | ICD-10-CM | POA: Diagnosis not present

## 2020-05-05 DIAGNOSIS — E7849 Other hyperlipidemia: Secondary | ICD-10-CM | POA: Diagnosis not present

## 2020-05-05 DIAGNOSIS — E039 Hypothyroidism, unspecified: Secondary | ICD-10-CM | POA: Diagnosis not present

## 2020-05-05 DIAGNOSIS — F329 Major depressive disorder, single episode, unspecified: Secondary | ICD-10-CM | POA: Diagnosis not present

## 2020-07-05 DIAGNOSIS — E039 Hypothyroidism, unspecified: Secondary | ICD-10-CM | POA: Diagnosis not present

## 2020-07-05 DIAGNOSIS — E7849 Other hyperlipidemia: Secondary | ICD-10-CM | POA: Diagnosis not present

## 2020-07-05 DIAGNOSIS — F329 Major depressive disorder, single episode, unspecified: Secondary | ICD-10-CM | POA: Diagnosis not present

## 2020-08-05 DIAGNOSIS — F329 Major depressive disorder, single episode, unspecified: Secondary | ICD-10-CM | POA: Diagnosis not present

## 2020-08-05 DIAGNOSIS — E039 Hypothyroidism, unspecified: Secondary | ICD-10-CM | POA: Diagnosis not present

## 2020-08-05 DIAGNOSIS — E7849 Other hyperlipidemia: Secondary | ICD-10-CM | POA: Diagnosis not present

## 2020-08-16 DIAGNOSIS — E039 Hypothyroidism, unspecified: Secondary | ICD-10-CM | POA: Diagnosis not present

## 2020-08-16 DIAGNOSIS — W19XXXA Unspecified fall, initial encounter: Secondary | ICD-10-CM | POA: Diagnosis not present

## 2020-08-16 DIAGNOSIS — G7100 Muscular dystrophy, unspecified: Secondary | ICD-10-CM | POA: Diagnosis not present

## 2020-08-16 DIAGNOSIS — F329 Major depressive disorder, single episode, unspecified: Secondary | ICD-10-CM | POA: Diagnosis not present

## 2020-08-16 DIAGNOSIS — Z681 Body mass index (BMI) 19 or less, adult: Secondary | ICD-10-CM | POA: Diagnosis not present

## 2020-09-03 DIAGNOSIS — E039 Hypothyroidism, unspecified: Secondary | ICD-10-CM | POA: Diagnosis not present

## 2020-09-03 DIAGNOSIS — E7849 Other hyperlipidemia: Secondary | ICD-10-CM | POA: Diagnosis not present

## 2020-09-03 DIAGNOSIS — F329 Major depressive disorder, single episode, unspecified: Secondary | ICD-10-CM | POA: Diagnosis not present

## 2020-10-31 ENCOUNTER — Other Ambulatory Visit: Payer: Self-pay

## 2020-10-31 ENCOUNTER — Ambulatory Visit: Payer: Medicare HMO | Admitting: Podiatry

## 2020-10-31 DIAGNOSIS — B351 Tinea unguium: Secondary | ICD-10-CM | POA: Diagnosis not present

## 2020-10-31 DIAGNOSIS — M79675 Pain in left toe(s): Secondary | ICD-10-CM

## 2020-10-31 DIAGNOSIS — M2042 Other hammer toe(s) (acquired), left foot: Secondary | ICD-10-CM | POA: Diagnosis not present

## 2020-10-31 DIAGNOSIS — M79674 Pain in right toe(s): Secondary | ICD-10-CM | POA: Diagnosis not present

## 2020-10-31 DIAGNOSIS — M2041 Other hammer toe(s) (acquired), right foot: Secondary | ICD-10-CM | POA: Diagnosis not present

## 2020-10-31 DIAGNOSIS — M21619 Bunion of unspecified foot: Secondary | ICD-10-CM | POA: Diagnosis not present

## 2020-10-31 DIAGNOSIS — R2681 Unsteadiness on feet: Secondary | ICD-10-CM

## 2020-11-02 DIAGNOSIS — E039 Hypothyroidism, unspecified: Secondary | ICD-10-CM | POA: Diagnosis not present

## 2020-11-02 DIAGNOSIS — F329 Major depressive disorder, single episode, unspecified: Secondary | ICD-10-CM | POA: Diagnosis not present

## 2020-11-02 DIAGNOSIS — E7849 Other hyperlipidemia: Secondary | ICD-10-CM | POA: Diagnosis not present

## 2020-11-03 NOTE — Progress Notes (Signed)
Subjective: 70 year old female presents the office with her husband for concerns of hammertoes, digital contractures.  She is having gait problems with her muscular dystrophy and is not very mobile because of this but when she does walk she feels that her toes curl.  She also feels this causes her to be off balance.  Also concern is becoming thickened discolored. Denies any systemic complaints such as fevers, chills, nausea, vomiting. No acute changes since last appointment, and no other complaints at this time.   Objective: AAO x3, NAD DP/PT pulses palpable bilaterally, CRT less than 3 seconds Nails are hypertrophic, dystrophic, brittle, discolored, elongated 10. No surrounding redness or drainage. Tenderness nails 1-5 bilaterally. No open lesions or pre-ulcerative lesions are identified today.  Hammertoe contractures with digital deformity evident.  On the right side hallux and second overlapping second toe as well.  No particular pain on the areas with palpation.  There is no open lesions. No pain with calf compression, swelling, warmth, erythema  Assessment: Symptomatic onychomycosis, digital deformities with gait instability  Plan: -All treatment options discussed with the patient including all alternatives, risks, complications.  -Sharply debrided the nails x10 without any complications or bleeding. -In regards to residual from a dispensed toe crest.  Discussed different measures including toe socks help with the toe separated to hopefully help with balance as well.  We discussed physical therapy.  Consider this to hold off on a referral today. -Patient encouraged to call the office with any questions, concerns, change in symptoms.   Trula Slade DPM

## 2020-11-07 DIAGNOSIS — G7109 Other specified muscular dystrophies: Secondary | ICD-10-CM | POA: Diagnosis not present

## 2020-11-07 DIAGNOSIS — H02423 Myogenic ptosis of bilateral eyelids: Secondary | ICD-10-CM | POA: Diagnosis not present

## 2020-11-07 DIAGNOSIS — H53461 Homonymous bilateral field defects, right side: Secondary | ICD-10-CM | POA: Diagnosis not present

## 2020-11-24 ENCOUNTER — Other Ambulatory Visit (HOSPITAL_COMMUNITY): Payer: Self-pay | Admitting: Family Medicine

## 2020-11-24 DIAGNOSIS — Z1231 Encounter for screening mammogram for malignant neoplasm of breast: Secondary | ICD-10-CM

## 2020-12-02 ENCOUNTER — Other Ambulatory Visit: Payer: Self-pay

## 2020-12-02 ENCOUNTER — Ambulatory Visit (HOSPITAL_COMMUNITY)
Admission: RE | Admit: 2020-12-02 | Discharge: 2020-12-02 | Disposition: A | Discharge status: Home / Self Care | Payer: Medicare HMO | Source: Ambulatory Visit | Attending: Family Medicine | Admitting: Family Medicine

## 2020-12-02 DIAGNOSIS — Z1231 Encounter for screening mammogram for malignant neoplasm of breast: Secondary | ICD-10-CM | POA: Insufficient documentation

## 2021-01-03 DIAGNOSIS — F329 Major depressive disorder, single episode, unspecified: Secondary | ICD-10-CM | POA: Diagnosis not present

## 2021-01-03 DIAGNOSIS — E039 Hypothyroidism, unspecified: Secondary | ICD-10-CM | POA: Diagnosis not present

## 2021-01-03 DIAGNOSIS — E7849 Other hyperlipidemia: Secondary | ICD-10-CM | POA: Diagnosis not present

## 2021-01-17 DIAGNOSIS — H5210 Myopia, unspecified eye: Secondary | ICD-10-CM | POA: Diagnosis not present

## 2021-01-19 ENCOUNTER — Other Ambulatory Visit: Payer: Self-pay

## 2021-01-19 ENCOUNTER — Ambulatory Visit (INDEPENDENT_AMBULATORY_CARE_PROVIDER_SITE_OTHER): Payer: Medicare HMO | Admitting: Podiatry

## 2021-01-19 ENCOUNTER — Ambulatory Visit (INDEPENDENT_AMBULATORY_CARE_PROVIDER_SITE_OTHER): Payer: Medicare HMO

## 2021-01-19 ENCOUNTER — Encounter: Payer: Self-pay | Admitting: Podiatry

## 2021-01-19 DIAGNOSIS — M79674 Pain in right toe(s): Secondary | ICD-10-CM

## 2021-01-19 DIAGNOSIS — M2042 Other hammer toe(s) (acquired), left foot: Secondary | ICD-10-CM

## 2021-01-19 DIAGNOSIS — R2681 Unsteadiness on feet: Secondary | ICD-10-CM | POA: Diagnosis not present

## 2021-01-19 DIAGNOSIS — B351 Tinea unguium: Secondary | ICD-10-CM

## 2021-01-19 DIAGNOSIS — M2041 Other hammer toe(s) (acquired), right foot: Secondary | ICD-10-CM | POA: Diagnosis not present

## 2021-01-19 DIAGNOSIS — M79675 Pain in left toe(s): Secondary | ICD-10-CM | POA: Diagnosis not present

## 2021-01-19 NOTE — Progress Notes (Signed)
Subjective:   Patient ID: [name], female   DOB: 70 y.o.   MRN: [id number]   HPI Patient states she has muscular dystrophy and is developing more curling of her toes and has thick yellow nails that she cannot take care of.  Patient had previous bunion surgery done and feels unstable on her feet with the muscular dystrophy big and the biggest problem   ROS      Objective:  Physical Exam  Neurovascular status found to be intact with thick yellow brittle nailbeds 1-5 both feet and possible for her to cut painful and digital deformities right along with bunion deformity and neuropathic-like symptomatology with gait instability     Assessment:  Numerous problems related to muscular dystrophy with mycotic nail infection also noted     Plan:  H&P reviewed both conditions debrided nailbeds 1-5 both feet and discussed possible balance bracing to try to help with stability discussed her structural deformities the only thing we could consider would be first metatarsal fusion which we like to hold off on currently  X-rays indicate that there is significant structural malalignment with digital deformities right over left

## 2021-02-01 DIAGNOSIS — M5410 Radiculopathy, site unspecified: Secondary | ICD-10-CM | POA: Diagnosis not present

## 2021-02-01 DIAGNOSIS — Z6822 Body mass index (BMI) 22.0-22.9, adult: Secondary | ICD-10-CM | POA: Diagnosis not present

## 2021-02-15 DIAGNOSIS — Z1331 Encounter for screening for depression: Secondary | ICD-10-CM | POA: Diagnosis not present

## 2021-02-15 DIAGNOSIS — F329 Major depressive disorder, single episode, unspecified: Secondary | ICD-10-CM | POA: Diagnosis not present

## 2021-02-15 DIAGNOSIS — E039 Hypothyroidism, unspecified: Secondary | ICD-10-CM | POA: Diagnosis not present

## 2021-02-15 DIAGNOSIS — Z1389 Encounter for screening for other disorder: Secondary | ICD-10-CM | POA: Diagnosis not present

## 2021-02-15 DIAGNOSIS — Z0001 Encounter for general adult medical examination with abnormal findings: Secondary | ICD-10-CM | POA: Diagnosis not present

## 2021-02-15 DIAGNOSIS — Z6821 Body mass index (BMI) 21.0-21.9, adult: Secondary | ICD-10-CM | POA: Diagnosis not present

## 2021-02-15 DIAGNOSIS — E782 Mixed hyperlipidemia: Secondary | ICD-10-CM | POA: Diagnosis not present

## 2021-02-15 DIAGNOSIS — K219 Gastro-esophageal reflux disease without esophagitis: Secondary | ICD-10-CM | POA: Diagnosis not present

## 2021-02-22 DIAGNOSIS — M21539 Acquired clawfoot, unspecified foot: Secondary | ICD-10-CM | POA: Diagnosis not present

## 2021-02-22 DIAGNOSIS — R2689 Other abnormalities of gait and mobility: Secondary | ICD-10-CM | POA: Diagnosis not present

## 2021-02-22 DIAGNOSIS — R2681 Unsteadiness on feet: Secondary | ICD-10-CM | POA: Diagnosis not present

## 2021-02-22 DIAGNOSIS — M6281 Muscle weakness (generalized): Secondary | ICD-10-CM | POA: Diagnosis not present

## 2021-02-22 DIAGNOSIS — M25671 Stiffness of right ankle, not elsewhere classified: Secondary | ICD-10-CM | POA: Diagnosis not present

## 2021-02-22 DIAGNOSIS — M419 Scoliosis, unspecified: Secondary | ICD-10-CM | POA: Diagnosis not present

## 2021-02-22 DIAGNOSIS — M25672 Stiffness of left ankle, not elsewhere classified: Secondary | ICD-10-CM | POA: Diagnosis not present

## 2021-02-24 DIAGNOSIS — M21539 Acquired clawfoot, unspecified foot: Secondary | ICD-10-CM | POA: Diagnosis not present

## 2021-02-24 DIAGNOSIS — M25671 Stiffness of right ankle, not elsewhere classified: Secondary | ICD-10-CM | POA: Diagnosis not present

## 2021-02-24 DIAGNOSIS — M419 Scoliosis, unspecified: Secondary | ICD-10-CM | POA: Diagnosis not present

## 2021-02-24 DIAGNOSIS — R2681 Unsteadiness on feet: Secondary | ICD-10-CM | POA: Diagnosis not present

## 2021-02-24 DIAGNOSIS — R2689 Other abnormalities of gait and mobility: Secondary | ICD-10-CM | POA: Diagnosis not present

## 2021-02-24 DIAGNOSIS — M25672 Stiffness of left ankle, not elsewhere classified: Secondary | ICD-10-CM | POA: Diagnosis not present

## 2021-02-24 DIAGNOSIS — M6281 Muscle weakness (generalized): Secondary | ICD-10-CM | POA: Diagnosis not present

## 2021-02-27 DIAGNOSIS — M21539 Acquired clawfoot, unspecified foot: Secondary | ICD-10-CM | POA: Diagnosis not present

## 2021-02-27 DIAGNOSIS — R2689 Other abnormalities of gait and mobility: Secondary | ICD-10-CM | POA: Diagnosis not present

## 2021-02-27 DIAGNOSIS — R2681 Unsteadiness on feet: Secondary | ICD-10-CM | POA: Diagnosis not present

## 2021-02-27 DIAGNOSIS — M6281 Muscle weakness (generalized): Secondary | ICD-10-CM | POA: Diagnosis not present

## 2021-02-27 DIAGNOSIS — M25672 Stiffness of left ankle, not elsewhere classified: Secondary | ICD-10-CM | POA: Diagnosis not present

## 2021-02-27 DIAGNOSIS — M419 Scoliosis, unspecified: Secondary | ICD-10-CM | POA: Diagnosis not present

## 2021-02-27 DIAGNOSIS — M25671 Stiffness of right ankle, not elsewhere classified: Secondary | ICD-10-CM | POA: Diagnosis not present

## 2021-03-01 DIAGNOSIS — M419 Scoliosis, unspecified: Secondary | ICD-10-CM | POA: Diagnosis not present

## 2021-03-01 DIAGNOSIS — M6281 Muscle weakness (generalized): Secondary | ICD-10-CM | POA: Diagnosis not present

## 2021-03-01 DIAGNOSIS — R2681 Unsteadiness on feet: Secondary | ICD-10-CM | POA: Diagnosis not present

## 2021-03-01 DIAGNOSIS — M21539 Acquired clawfoot, unspecified foot: Secondary | ICD-10-CM | POA: Diagnosis not present

## 2021-03-01 DIAGNOSIS — M25671 Stiffness of right ankle, not elsewhere classified: Secondary | ICD-10-CM | POA: Diagnosis not present

## 2021-03-01 DIAGNOSIS — M25672 Stiffness of left ankle, not elsewhere classified: Secondary | ICD-10-CM | POA: Diagnosis not present

## 2021-03-01 DIAGNOSIS — R2689 Other abnormalities of gait and mobility: Secondary | ICD-10-CM | POA: Diagnosis not present

## 2021-03-03 DIAGNOSIS — M21539 Acquired clawfoot, unspecified foot: Secondary | ICD-10-CM | POA: Diagnosis not present

## 2021-03-03 DIAGNOSIS — M419 Scoliosis, unspecified: Secondary | ICD-10-CM | POA: Diagnosis not present

## 2021-03-03 DIAGNOSIS — M6281 Muscle weakness (generalized): Secondary | ICD-10-CM | POA: Diagnosis not present

## 2021-03-03 DIAGNOSIS — M25671 Stiffness of right ankle, not elsewhere classified: Secondary | ICD-10-CM | POA: Diagnosis not present

## 2021-03-03 DIAGNOSIS — R2689 Other abnormalities of gait and mobility: Secondary | ICD-10-CM | POA: Diagnosis not present

## 2021-03-03 DIAGNOSIS — R2681 Unsteadiness on feet: Secondary | ICD-10-CM | POA: Diagnosis not present

## 2021-03-03 DIAGNOSIS — M25672 Stiffness of left ankle, not elsewhere classified: Secondary | ICD-10-CM | POA: Diagnosis not present

## 2021-03-07 DIAGNOSIS — R2681 Unsteadiness on feet: Secondary | ICD-10-CM | POA: Diagnosis not present

## 2021-03-07 DIAGNOSIS — R2689 Other abnormalities of gait and mobility: Secondary | ICD-10-CM | POA: Diagnosis not present

## 2021-03-07 DIAGNOSIS — M6281 Muscle weakness (generalized): Secondary | ICD-10-CM | POA: Diagnosis not present

## 2021-03-07 DIAGNOSIS — M21539 Acquired clawfoot, unspecified foot: Secondary | ICD-10-CM | POA: Diagnosis not present

## 2021-03-07 DIAGNOSIS — M25671 Stiffness of right ankle, not elsewhere classified: Secondary | ICD-10-CM | POA: Diagnosis not present

## 2021-03-07 DIAGNOSIS — M419 Scoliosis, unspecified: Secondary | ICD-10-CM | POA: Diagnosis not present

## 2021-03-07 DIAGNOSIS — M25672 Stiffness of left ankle, not elsewhere classified: Secondary | ICD-10-CM | POA: Diagnosis not present

## 2021-03-08 DIAGNOSIS — M21539 Acquired clawfoot, unspecified foot: Secondary | ICD-10-CM | POA: Diagnosis not present

## 2021-03-08 DIAGNOSIS — M25672 Stiffness of left ankle, not elsewhere classified: Secondary | ICD-10-CM | POA: Diagnosis not present

## 2021-03-08 DIAGNOSIS — M25671 Stiffness of right ankle, not elsewhere classified: Secondary | ICD-10-CM | POA: Diagnosis not present

## 2021-03-08 DIAGNOSIS — M419 Scoliosis, unspecified: Secondary | ICD-10-CM | POA: Diagnosis not present

## 2021-03-08 DIAGNOSIS — M6281 Muscle weakness (generalized): Secondary | ICD-10-CM | POA: Diagnosis not present

## 2021-03-08 DIAGNOSIS — R2681 Unsteadiness on feet: Secondary | ICD-10-CM | POA: Diagnosis not present

## 2021-03-08 DIAGNOSIS — R2689 Other abnormalities of gait and mobility: Secondary | ICD-10-CM | POA: Diagnosis not present

## 2021-03-10 DIAGNOSIS — M419 Scoliosis, unspecified: Secondary | ICD-10-CM | POA: Diagnosis not present

## 2021-03-10 DIAGNOSIS — M6281 Muscle weakness (generalized): Secondary | ICD-10-CM | POA: Diagnosis not present

## 2021-03-10 DIAGNOSIS — R2681 Unsteadiness on feet: Secondary | ICD-10-CM | POA: Diagnosis not present

## 2021-03-10 DIAGNOSIS — M25672 Stiffness of left ankle, not elsewhere classified: Secondary | ICD-10-CM | POA: Diagnosis not present

## 2021-03-10 DIAGNOSIS — R2689 Other abnormalities of gait and mobility: Secondary | ICD-10-CM | POA: Diagnosis not present

## 2021-03-10 DIAGNOSIS — M25671 Stiffness of right ankle, not elsewhere classified: Secondary | ICD-10-CM | POA: Diagnosis not present

## 2021-03-10 DIAGNOSIS — M21539 Acquired clawfoot, unspecified foot: Secondary | ICD-10-CM | POA: Diagnosis not present

## 2021-03-10 DIAGNOSIS — M545 Low back pain, unspecified: Secondary | ICD-10-CM | POA: Diagnosis not present

## 2021-03-13 DIAGNOSIS — M6281 Muscle weakness (generalized): Secondary | ICD-10-CM | POA: Diagnosis not present

## 2021-03-13 DIAGNOSIS — M25672 Stiffness of left ankle, not elsewhere classified: Secondary | ICD-10-CM | POA: Diagnosis not present

## 2021-03-13 DIAGNOSIS — M25671 Stiffness of right ankle, not elsewhere classified: Secondary | ICD-10-CM | POA: Diagnosis not present

## 2021-03-13 DIAGNOSIS — M21539 Acquired clawfoot, unspecified foot: Secondary | ICD-10-CM | POA: Diagnosis not present

## 2021-03-13 DIAGNOSIS — M419 Scoliosis, unspecified: Secondary | ICD-10-CM | POA: Diagnosis not present

## 2021-03-13 DIAGNOSIS — R2689 Other abnormalities of gait and mobility: Secondary | ICD-10-CM | POA: Diagnosis not present

## 2021-03-13 DIAGNOSIS — R2681 Unsteadiness on feet: Secondary | ICD-10-CM | POA: Diagnosis not present

## 2021-03-13 DIAGNOSIS — M545 Low back pain, unspecified: Secondary | ICD-10-CM | POA: Diagnosis not present

## 2021-03-15 DIAGNOSIS — R2681 Unsteadiness on feet: Secondary | ICD-10-CM | POA: Diagnosis not present

## 2021-03-15 DIAGNOSIS — R2689 Other abnormalities of gait and mobility: Secondary | ICD-10-CM | POA: Diagnosis not present

## 2021-03-15 DIAGNOSIS — M6281 Muscle weakness (generalized): Secondary | ICD-10-CM | POA: Diagnosis not present

## 2021-03-15 DIAGNOSIS — M545 Low back pain, unspecified: Secondary | ICD-10-CM | POA: Diagnosis not present

## 2021-03-15 DIAGNOSIS — M25671 Stiffness of right ankle, not elsewhere classified: Secondary | ICD-10-CM | POA: Diagnosis not present

## 2021-03-15 DIAGNOSIS — M25672 Stiffness of left ankle, not elsewhere classified: Secondary | ICD-10-CM | POA: Diagnosis not present

## 2021-03-15 DIAGNOSIS — M21539 Acquired clawfoot, unspecified foot: Secondary | ICD-10-CM | POA: Diagnosis not present

## 2021-03-15 DIAGNOSIS — M419 Scoliosis, unspecified: Secondary | ICD-10-CM | POA: Diagnosis not present

## 2021-03-17 DIAGNOSIS — M6281 Muscle weakness (generalized): Secondary | ICD-10-CM | POA: Diagnosis not present

## 2021-03-17 DIAGNOSIS — M419 Scoliosis, unspecified: Secondary | ICD-10-CM | POA: Diagnosis not present

## 2021-03-17 DIAGNOSIS — M545 Low back pain, unspecified: Secondary | ICD-10-CM | POA: Diagnosis not present

## 2021-03-17 DIAGNOSIS — M25672 Stiffness of left ankle, not elsewhere classified: Secondary | ICD-10-CM | POA: Diagnosis not present

## 2021-03-17 DIAGNOSIS — R2689 Other abnormalities of gait and mobility: Secondary | ICD-10-CM | POA: Diagnosis not present

## 2021-03-17 DIAGNOSIS — M25671 Stiffness of right ankle, not elsewhere classified: Secondary | ICD-10-CM | POA: Diagnosis not present

## 2021-03-17 DIAGNOSIS — M21539 Acquired clawfoot, unspecified foot: Secondary | ICD-10-CM | POA: Diagnosis not present

## 2021-03-17 DIAGNOSIS — R2681 Unsteadiness on feet: Secondary | ICD-10-CM | POA: Diagnosis not present

## 2021-03-20 DIAGNOSIS — R2689 Other abnormalities of gait and mobility: Secondary | ICD-10-CM | POA: Diagnosis not present

## 2021-03-20 DIAGNOSIS — M545 Low back pain, unspecified: Secondary | ICD-10-CM | POA: Diagnosis not present

## 2021-03-20 DIAGNOSIS — R2681 Unsteadiness on feet: Secondary | ICD-10-CM | POA: Diagnosis not present

## 2021-03-20 DIAGNOSIS — M21539 Acquired clawfoot, unspecified foot: Secondary | ICD-10-CM | POA: Diagnosis not present

## 2021-03-20 DIAGNOSIS — M25672 Stiffness of left ankle, not elsewhere classified: Secondary | ICD-10-CM | POA: Diagnosis not present

## 2021-03-20 DIAGNOSIS — M25671 Stiffness of right ankle, not elsewhere classified: Secondary | ICD-10-CM | POA: Diagnosis not present

## 2021-03-20 DIAGNOSIS — M419 Scoliosis, unspecified: Secondary | ICD-10-CM | POA: Diagnosis not present

## 2021-03-20 DIAGNOSIS — M6281 Muscle weakness (generalized): Secondary | ICD-10-CM | POA: Diagnosis not present

## 2021-03-24 DIAGNOSIS — M25672 Stiffness of left ankle, not elsewhere classified: Secondary | ICD-10-CM | POA: Diagnosis not present

## 2021-03-24 DIAGNOSIS — M6281 Muscle weakness (generalized): Secondary | ICD-10-CM | POA: Diagnosis not present

## 2021-03-24 DIAGNOSIS — M25671 Stiffness of right ankle, not elsewhere classified: Secondary | ICD-10-CM | POA: Diagnosis not present

## 2021-03-24 DIAGNOSIS — M21539 Acquired clawfoot, unspecified foot: Secondary | ICD-10-CM | POA: Diagnosis not present

## 2021-03-24 DIAGNOSIS — M545 Low back pain, unspecified: Secondary | ICD-10-CM | POA: Diagnosis not present

## 2021-03-24 DIAGNOSIS — R2681 Unsteadiness on feet: Secondary | ICD-10-CM | POA: Diagnosis not present

## 2021-03-24 DIAGNOSIS — R2689 Other abnormalities of gait and mobility: Secondary | ICD-10-CM | POA: Diagnosis not present

## 2021-03-24 DIAGNOSIS — M419 Scoliosis, unspecified: Secondary | ICD-10-CM | POA: Diagnosis not present

## 2021-03-28 ENCOUNTER — Encounter: Payer: Self-pay | Admitting: Physical Medicine and Rehabilitation

## 2021-03-28 DIAGNOSIS — M25671 Stiffness of right ankle, not elsewhere classified: Secondary | ICD-10-CM | POA: Diagnosis not present

## 2021-03-28 DIAGNOSIS — M25672 Stiffness of left ankle, not elsewhere classified: Secondary | ICD-10-CM | POA: Diagnosis not present

## 2021-03-28 DIAGNOSIS — M419 Scoliosis, unspecified: Secondary | ICD-10-CM | POA: Diagnosis not present

## 2021-03-28 DIAGNOSIS — M21539 Acquired clawfoot, unspecified foot: Secondary | ICD-10-CM | POA: Diagnosis not present

## 2021-03-28 DIAGNOSIS — R2689 Other abnormalities of gait and mobility: Secondary | ICD-10-CM | POA: Diagnosis not present

## 2021-03-28 DIAGNOSIS — R2681 Unsteadiness on feet: Secondary | ICD-10-CM | POA: Diagnosis not present

## 2021-03-28 DIAGNOSIS — M6281 Muscle weakness (generalized): Secondary | ICD-10-CM | POA: Diagnosis not present

## 2021-03-28 DIAGNOSIS — M545 Low back pain, unspecified: Secondary | ICD-10-CM | POA: Diagnosis not present

## 2021-04-05 ENCOUNTER — Encounter: Payer: Medicare HMO | Attending: Physical Medicine and Rehabilitation | Admitting: Physical Medicine and Rehabilitation

## 2021-04-05 ENCOUNTER — Other Ambulatory Visit: Payer: Self-pay

## 2021-04-05 ENCOUNTER — Telehealth: Payer: Self-pay | Admitting: *Deleted

## 2021-04-05 ENCOUNTER — Encounter: Payer: Self-pay | Admitting: Physical Medicine and Rehabilitation

## 2021-04-05 VITALS — BP 130/75 | HR 54 | Temp 98.3°F | Ht 62.5 in | Wt 116.0 lb

## 2021-04-05 DIAGNOSIS — M7918 Myalgia, other site: Secondary | ICD-10-CM | POA: Insufficient documentation

## 2021-04-05 DIAGNOSIS — R2689 Other abnormalities of gait and mobility: Secondary | ICD-10-CM | POA: Diagnosis not present

## 2021-04-05 MED ORDER — LIDOCAINE 5 % EX PTCH: 1.0000 | MEDICATED_PATCH | CUTANEOUS | 0 refills | Status: DC

## 2021-04-05 MED ORDER — NAC 600 MG PO CAPS: 1.0000 | ORAL_CAPSULE | Freq: Two times a day (BID) | ORAL | 30 refills | Status: DC

## 2021-04-05 NOTE — Progress Notes (Signed)
Subjective:    Patient ID: [name], female    DOB: 05-Aug-1951, 70 y.o.   MRN: [id number]  HPI [name] is a 70 year old woman who presents to establish care for for myofascial pan on her left thoracic spine.  -she feels well today -used to be able to lift heavy things -only on the left side -she has been going therapy for 2 weeks -denies vertigo -notes balance deficits -did response to therapy in terms of   Pain Inventory Average Pain 8 Pain Right Now 4 My pain is sharp  In the last 24 hours, has pain interfered with the following? General activity 4 Relation with others 7 Enjoyment of life 7 What TIME of day is your pain at its worst? morning  and evening Sleep (in general) Good  Pain is worse with: walking, sitting, standing, and some activites Pain improves with: therapy/exercise Relief from Meds: 0  walk with assistance use a cane use a walker how many minutes can you walk? 30 ability to climb steps?  yes do you drive?  no transfers alone  retired  trouble walking dizziness confusion depression  Any changes since last visit?  no  Primary care Sharilyn Sites MD    Family History  Problem Relation Age of Onset   Hypothyroidism Mother    Varicose Veins Mother    Heart disease Mother    Muscular dystrophy Father    Obesity Sister    Muscular dystrophy Brother    Obesity Brother    Obesity Brother    Colon cancer Neg Hx    Social History   Socioeconomic History   Marital status: Married    Spouse name: Not on file   Number of children: 2   Years of education: Not on file   Highest education level: Not on file  Occupational History   Occupation: retired    Fish farm manager: UNEMPLOYED    Comment: Print production planner  Tobacco Use   Smoking status: Former   Smokeless tobacco: Never   Tobacco comments:    quit 1979  Substance and Sexual Activity   Alcohol use: No   Drug use: No   Sexual activity: Not on file  Other Topics Concern    Not on file  Social History Narrative   Not on file   Social Determinants of Health   Financial Resource Strain: Not on file  Food Insecurity: Not on file  Transportation Needs: Not on file  Physical Activity: Not on file  Stress: Not on file  Social Connections: Not on file   Past Surgical History:  Procedure Laterality Date   BELPHAROPTOSIS REPAIR Bilateral    COLONOSCOPY  Oc 2005   normal colonoscopy   COLONOSCOPY N/A 07/25/2016   Procedure: COLONOSCOPY;  Surgeon: Daneil Dolin, MD;  Location: [facility];  Service: Endoscopy;  Laterality: N/A;  9:15 AM   pH and manometry   04/28/2002   RMR: Nonspecific motility disorder, DeMeester score 5.3, adequate control of reflux   throat muscle removed     Past Medical History:  Diagnosis Date   Broken arm    right   GERD (gastroesophageal reflux disease)    Hyperlipidemia    Hypothyroidism    Muscular dystrophy ([facility])    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[facility])    BP 130/75   Pulse (!) 54   Temp 98.3 F (36.8 C)   Ht 5' 2.5" (1.588 m)   Wt 116 lb (52.6 kg)   SpO2 98%  BMI 20.88 kg/m   Opioid Risk Score:   Fall Risk Score:  `1  Depression screen PHQ 2/9  Depression screen PHQ 2/9 04/05/2021  Decreased Interest 1  Down, Depressed, Hopeless 0  PHQ - 2 Score 1  Altered sleeping 0  Tired, decreased energy 1  Change in appetite 0  Feeling bad or failure about yourself  0  Trouble concentrating 0  Moving slowly or fidgety/restless 1  Suicidal thoughts 1  PHQ-9 Score 4  Difficult doing work/chores Somewhat difficult     Review of Systems  Constitutional: Negative.   HENT: Negative.    Eyes: Negative.   Respiratory: Negative.    Cardiovascular: Negative.   Gastrointestinal: Negative.   Endocrine: Negative.   Genitourinary: Negative.   Musculoskeletal:  Positive for back pain and gait problem.  Skin: Negative.   Allergic/Immunologic: Negative.   Neurological:  Positive for dizziness.  Hematological:  Negative.   Psychiatric/Behavioral:  Positive for confusion and dysphoric mood.   All other systems reviewed and are negative.     Objective:   Physical Exam  Gen: no distress, normal appearing HEENT: oral mucosa pink and moist, NCAT Cardio: Reg rate Chest: normal effort, normal rate of breathing Abd: soft, non-distended Ext: no edema Psych: pleasant, normal affect Skin: intact Neuro: Alert and oriented x3.  Musculoskeletal:  tightness along left thoracic paraspinals. Ambulating with cane.       Assessment & Plan:  [name] is a 70 year old woman who presents to establish care for left sided thoracic myofascial pain.  1) Chronic Pain Syndrome secondary to left sided thoracic myofascial pain -Discussed current symptoms of pain and history of pain.  -Discussed benefits of exercise in reducing pain. -continue therapy -prescribed 5% lidocaine patch -prescribed NAC 656m BID -Discussed following foods that may reduce pain: 1) Ginger (especially studied for arthritis)- reduce leukotriene production to decrease inflammation 2) Blueberries- high in phytonutrients that decrease inflammation 3) Salmon- marine omega-3s reduce joint swelling and pain 4) Pumpkin seeds- reduce inflammation 5) dark chocolate- reduces inflammation 6) turmeric- reduces inflammation 7) tart cherries - reduce pain and stiffness 8) extra virgin olive oil - its compound olecanthal helps to block prostaglandins  9) chili peppers- can be eaten or applied topically via capsaicin 10) mint- helpful for headache, muscle aches, joint pain, and itching 11) garlic- reduces inflammation  Link to further information on diet for chronic pain: [email or web address]  2) Impaired balance -discussed potential etiologies -recommended Tai Chi -Reviewed 2017 Brain MRI with her

## 2021-04-05 NOTE — Patient Instructions (Signed)
Benefits of Yoga  There are numerous studies regarding the health benefits of yoga:  -improves strength, balance, and flexibility  -prevents and alleviates back pain  -alleviates symptoms of arthritis  -benefits heart health  -aids relaxation, better sleep  -improves energy and brightens mood, boosts alertness  -decreases stress  -can be great socialization when done with others   Poses: -Tree pose (vrksasana) -Cat-cow pose (bitilasana) -Downward dog (adho mukha shvanasana) -Legs up the wall pose (viparita karani) -corpse pose (savasana)    Foods for pain:  1) Ginger (especially studied for arthritis)- reduce leukotriene production to decrease inflammation 2) Blueberries- high in phytonutrients that decrease inflammation 3) Salmon- marine omega-3s reduce joint swelling and pain 4) Pumpkin seeds- reduce inflammation 5) dark chocolate- reduces inflammation 6) turmeric- reduces inflammation 7) tart cherries - reduce pain and stiffness 8) extra virgin olive oil - its compound olecanthal helps to block prostaglandins  9) chili peppers- can be eaten or applied topically via capsaicin 10) mint- helpful for headache, muscle aches, joint pain, and itching 11) garlic- reduces inflammation  Link to further information on diet for chronic pain: [email or web address]

## 2021-04-05 NOTE — Telephone Encounter (Signed)
Lidocaine patch 5%  [name]  Donna Saner  PA Case ID: [id number]  Rx #: [id number]  Sent to plan

## 2021-04-05 NOTE — Telephone Encounter (Signed)
PA Case: [id number]  Status: Approved  Coverage Starts on: 08/06/2020  Coverage Ends on: 08/05/2021

## 2021-04-13 DIAGNOSIS — R471 Dysarthria and anarthria: Secondary | ICD-10-CM | POA: Diagnosis not present

## 2021-04-13 DIAGNOSIS — G7109 Other specified muscular dystrophies: Secondary | ICD-10-CM | POA: Diagnosis not present

## 2021-04-13 DIAGNOSIS — R413 Other amnesia: Secondary | ICD-10-CM | POA: Diagnosis not present

## 2021-04-13 DIAGNOSIS — R1312 Dysphagia, oropharyngeal phase: Secondary | ICD-10-CM | POA: Diagnosis not present

## 2021-04-13 DIAGNOSIS — R1310 Dysphagia, unspecified: Secondary | ICD-10-CM | POA: Diagnosis not present

## 2021-04-13 DIAGNOSIS — R1313 Dysphagia, pharyngeal phase: Secondary | ICD-10-CM | POA: Diagnosis not present

## 2021-04-25 DIAGNOSIS — R42 Dizziness and giddiness: Secondary | ICD-10-CM | POA: Diagnosis not present

## 2021-07-10 ENCOUNTER — Encounter: Payer: Medicare HMO | Attending: Physical Medicine and Rehabilitation | Admitting: Physical Medicine and Rehabilitation

## 2021-08-24 DIAGNOSIS — Z6820 Body mass index (BMI) 20.0-20.9, adult: Secondary | ICD-10-CM | POA: Diagnosis not present

## 2021-08-24 DIAGNOSIS — L570 Actinic keratosis: Secondary | ICD-10-CM | POA: Diagnosis not present

## 2021-08-24 DIAGNOSIS — E039 Hypothyroidism, unspecified: Secondary | ICD-10-CM | POA: Diagnosis not present

## 2021-09-13 DIAGNOSIS — J069 Acute upper respiratory infection, unspecified: Secondary | ICD-10-CM | POA: Diagnosis not present

## 2021-09-13 DIAGNOSIS — Z6820 Body mass index (BMI) 20.0-20.9, adult: Secondary | ICD-10-CM | POA: Diagnosis not present

## 2021-09-14 DIAGNOSIS — L821 Other seborrheic keratosis: Secondary | ICD-10-CM | POA: Diagnosis not present

## 2021-09-14 DIAGNOSIS — X32XXXD Exposure to sunlight, subsequent encounter: Secondary | ICD-10-CM | POA: Diagnosis not present

## 2021-09-14 DIAGNOSIS — L570 Actinic keratosis: Secondary | ICD-10-CM | POA: Diagnosis not present

## 2021-10-05 ENCOUNTER — Other Ambulatory Visit: Payer: Self-pay

## 2021-10-05 ENCOUNTER — Emergency Department (HOSPITAL_COMMUNITY): Payer: Medicare HMO

## 2021-10-05 ENCOUNTER — Encounter (HOSPITAL_COMMUNITY): Payer: Self-pay

## 2021-10-05 ENCOUNTER — Emergency Department (HOSPITAL_COMMUNITY)
Admission: EM | Admit: 2021-10-05 | Discharge: 2021-10-05 | Disposition: A | Discharge status: Home / Self Care | Payer: Medicare HMO | Attending: Emergency Medicine | Admitting: Emergency Medicine

## 2021-10-05 DIAGNOSIS — Z79899 Other long term (current) drug therapy: Secondary | ICD-10-CM | POA: Insufficient documentation

## 2021-10-05 DIAGNOSIS — R42 Dizziness and giddiness: Secondary | ICD-10-CM | POA: Insufficient documentation

## 2021-10-05 DIAGNOSIS — R262 Difficulty in walking, not elsewhere classified: Secondary | ICD-10-CM | POA: Diagnosis not present

## 2021-10-05 LAB — BASIC METABOLIC PANEL
Anion gap: 8 (ref 5–15)
BUN: 22 mg/dL (ref 8–23)
CO2: 27 mmol/L (ref 22–32)
Calcium: 9.4 mg/dL (ref 8.9–10.3)
Chloride: 101 mmol/L (ref 98–111)
Creatinine, Ser: 0.51 mg/dL (ref 0.44–1.00)
GFR, Estimated: >60 mL/min (ref >60)
Glucose, Bld: 83 mg/dL (ref 70–99)
Potassium: 4.1 mmol/L (ref 3.5–5.1)
Sodium: 136 mmol/L (ref 135–145)

## 2021-10-05 LAB — CBC WITH DIFFERENTIAL/PLATELET
Abs Immature Granulocytes: 0.01 10*3/uL (ref 0.00–0.07)
Basophils Absolute: 0 10*3/uL (ref 0.0–0.1)
Basophils Relative: 0 %
Eosinophils Absolute: 0.5 10*3/uL (ref 0.0–0.5)
Eosinophils Relative: 7 %
HCT: 41.5 % (ref 36.0–46.0)
Hemoglobin: 13.7 g/dL (ref 12.0–15.0)
Immature Granulocytes: 0 %
Lymphocytes Relative: 40 %
Lymphs Abs: 2.7 10*3/uL (ref 0.7–4.0)
MCH: 30.6 pg (ref 26.0–34.0)
MCHC: 33 g/dL (ref 30.0–36.0)
MCV: 92.8 fL (ref 80.0–100.0)
Monocytes Absolute: 0.4 10*3/uL (ref 0.1–1.0)
Monocytes Relative: 6 %
Neutro Abs: 3.2 10*3/uL (ref 1.7–7.7)
Neutrophils Relative %: 47 %
Platelets: 292 10*3/uL (ref 150–400)
RBC: 4.47 MIL/uL (ref 3.87–5.11)
RDW: 13.2 % (ref 11.5–15.5)
WBC: 6.8 10*3/uL (ref 4.0–10.5)
nRBC: 0 % (ref 0.0–0.2)

## 2021-10-05 MED ORDER — MECLIZINE HCL 12.5 MG PO TABS
25.0000 mg | ORAL_TABLET | Freq: Once | ORAL | Status: AC
  Administered 2021-10-05: 25 mg via ORAL
  Filled 2021-10-05: qty 2

## 2021-10-05 MED ORDER — SODIUM CHLORIDE 0.9 % IV BOLUS
1000.0000 mL | Freq: Once | INTRAVENOUS | Status: AC
  Administered 2021-10-05: 1000 mL via INTRAVENOUS

## 2021-10-05 MED ORDER — MECLIZINE HCL 25 MG PO TABS: 25.0000 mg | ORAL_TABLET | Freq: Three times a day (TID) | ORAL | 0 refills | Status: DC | PRN

## 2021-10-05 NOTE — ED Notes (Signed)
Had Zammit MD come to evaluate patient due to symptoms and hx.   ?

## 2021-10-05 NOTE — Discharge Instructions (Signed)
Follow-up with your family doctor next week for recheck drink plenty of fluids ?

## 2021-10-05 NOTE — ED Provider Notes (Signed)
?[facility] ?Provider Note ? ? ?CSN: [id number] ?Arrival date & time: 10/05/21  1600 ? ?  ? ?History ? ?Chief Complaint  ?Patient presents with  ? Dizziness  ? ? ?[name] is a 71 y.o. female. ? ?Patient complains of dizziness.  She states it is a lot worse when she walks around and she describes motion dizziness and lightheadedness, patient has history of thyroid disease ? ?The history is provided by the patient and medical records. No language interpreter was used.  ?Dizziness ?Quality:  Head spinning ?Onset quality:  Sudden ?Timing:  Constant ?Progression:  Waxing and waning ?Chronicity:  New ?Context: head movement   ?Associated symptoms: no chest pain, no diarrhea and no headaches   ? ?  ? ?Home Medications ?Prior to Admission medications   ?Medication Sig Start Date End Date Taking? Authorizing Provider  ?meclizine (ANTIVERT) 25 MG tablet Take 1 tablet (25 mg total) by mouth 3 (three) times daily as needed for dizziness. 10/05/21  Yes Milton Ferguson, MD  ?Acetylcysteine (NAC) 600 MG CAPS Take 1 capsule (600 mg total) by mouth 2 (two) times daily. 04/05/21   Izora Ribas, MD  ?alendronate (FOSAMAX) 70 MG tablet As directed 12/22/20   [provider]  ?atorvastatin (LIPITOR) 20 MG tablet Take 20 mg by mouth daily.  06/09/12   [provider]  ?ciclopirox (PENLAC) 8 % solution Apply topically at bedtime. Apply over nail and surrounding skin. Apply daily over previous coat. After seven (7) days, may remove with alcohol and continue cycle. 07/07/19   Trula Slade, DPM  ?diphenhydrAMINE (BENADRYL) 25 MG tablet Take 25 mg by mouth at bedtime as needed for sleep.    [provider]  ?escitalopram (LEXAPRO) 20 MG tablet Take 10 mg by mouth daily.  06/09/12   [provider]  ?esomeprazole (NEXIUM) 20 MG capsule Take 20 mg by mouth daily.    [provider]  ?levothyroxine (SYNTHROID, LEVOTHROID) 100 MCG tablet Take 100 mcg by mouth daily before  breakfast.    [provider]  ?lidocaine (LIDODERM) 5 % Place 1 patch onto the skin daily. Remove & Discard patch within 12 hours or as directed by MD 04/05/21   Ranell Patrick, Clide Deutscher, MD  ?Multiple Vitamin (MULTIVITAMIN WITH MINERALS) TABS tablet Take 1 tablet by mouth daily.    [provider]  ?[facility] ? ?Anti-fungal (nail)-#1    [provider]  ?promethazine (PHENERGAN) 25 MG tablet Take 1 tablet (25 mg total) by mouth every 8 (eight) hours as needed for nausea or vomiting. 04/29/19   Trula Slade, DPM  ?   ? ?Allergies    ?Ibuprofen   ? ?Review of Systems   ?Review of Systems  ?Constitutional:  Negative for appetite change and fatigue.  ?HENT:  Negative for congestion, ear discharge and sinus pressure.   ?Eyes:  Negative for discharge.  ?Respiratory:  Negative for cough.   ?Cardiovascular:  Negative for chest pain.  ?Gastrointestinal:  Negative for abdominal pain and diarrhea.  ?Genitourinary:  Negative for frequency and hematuria.  ?Musculoskeletal:  Negative for back pain.  ?Skin:  Negative for rash.  ?Neurological:  Positive for dizziness. Negative for seizures and headaches.  ?Psychiatric/Behavioral:  Negative for hallucinations.   ? ?Physical Exam ?Updated Vital Signs ?BP (!) 158/86   Pulse (!) 55   Temp (!) 97.4 ?F (36.3 ?C) (Oral)   Resp 15   Ht 5\' 2"  (1.575 m)   Wt 49.9  kg   SpO2 100%   BMI 20.12 kg/m?  ?Physical Exam ?Vitals and nursing note reviewed.  ?Constitutional:   ?   Appearance: She is well-developed.  ?HENT:  ?   Head: Normocephalic.  ?   Nose: Nose normal.  ?Eyes:  ?   General: No scleral icterus. ?   Conjunctiva/sclera: Conjunctivae normal.  ?Neck:  ?   Thyroid: No thyromegaly.  ?Cardiovascular:  ?   Rate and Rhythm: Normal rate and regular rhythm.  ?   Heart sounds: No murmur heard. ?  No friction rub. No gallop.  ?Pulmonary:  ?   Breath sounds: No stridor. No wheezing or rales.  ?Chest:  ?   Chest wall: No tenderness.   ?Abdominal:  ?   General: There is no distension.  ?   Tenderness: There is no abdominal tenderness. There is no rebound.  ?Musculoskeletal:     ?   General: Normal range of motion.  ?   Cervical back: Neck supple.  ?Lymphadenopathy:  ?   Cervical: No cervical adenopathy.  ?Skin: ?   Findings: No erythema or rash.  ?Neurological:  ?   Mental Status: She is alert and oriented to person, place, and time.  ?   Motor: No abnormal muscle tone.  ?   Coordination: Coordination normal.  ?Psychiatric:     ?   Behavior: Behavior normal.  ? ? ?ED Results / Procedures / Treatments   ?Labs ?(all labs ordered are listed, but only abnormal results are displayed) ?Labs Reviewed  ?CBC WITH DIFFERENTIAL/PLATELET  ?BASIC METABOLIC PANEL  ? ? ?EKG ?None ? ?Radiology ?MR BRAIN WO CONTRAST ? ?Result Date: 10/05/2021 ?CLINICAL DATA:  Acute neuro deficit. Rule out stroke. Dizziness and difficulty walking EXAM: MRI HEAD WITHOUT CONTRAST TECHNIQUE: Multiplanar, multiecho pulse sequences of the brain and surrounding structures were obtained without intravenous contrast. COMPARISON:  MRI head 01/03/2016 FINDINGS: Brain: Negative for acute infarct. Mild white matter changes with few small white matter hyperintensities. Mild hyperintensity in the pons. Hyperintensity left temporal lobe unchanged from 2017 most consistent with infarction. Vascular: Normal arterial flow voids. Skull and upper cervical spine: No focal abnormality Sinuses/Orbits: Mild mucosal edema paranasal sinuses. Negative orbit Other: None IMPRESSION: Negative for acute infarct.  Mild chronic ischemic change. Electronically Signed   By: Franchot Gallo M.D.   On: 10/05/2021 17:35   ? ?Procedures ?Procedures  ? ? ?Medications Ordered in ED ?Medications  ?sodium chloride 0.9 % bolus 1,000 mL (1,000 mLs Intravenous New Bag/Given 10/05/21 2108)  ?meclizine (ANTIVERT) tablet 25 mg (25 mg Oral Given 10/05/21 2102)  ? ? ?ED Course/ Medical Decision Making/ A&P ?  ?                         ?Medical Decision Making ?Amount and/or Complexity of Data Reviewed ?Labs: ordered. ?Radiology: ordered. ? ? ?This patient presents to the ED for concern of dizziness, this involves an extensive number of treatment options, and is a complaint that carries with it a high risk of complications and morbidity.  The differential diagnosis includes stroke vertigo ? ? ?Co morbidities that complicate the patient evaluation ? ?Thyroid disease ? ? ?Additional history obtained: ? ?Additional history obtained from spouse ?External records from outside source obtained and reviewed including hospital ? ? ?Lab Tests: ? ?I Ordered, and personally interpreted labs.  The pertinent results include: CBC and chemistries which were unremarkable ? ? ?Imaging Studies ordered: ? ?I ordered imaging studies including  MRI of the brain ?I independently visualized and interpreted imaging which showed negative ?I agree with the radiologist interpretation ? ? ?Cardiac Monitoring: ? ?The patient was maintained on a cardiac monitor.  I personally viewed and interpreted the cardiac monitored which showed an underlying rhythm of: Normal sinus rhythm ? ? ?Medicines ordered and prescription drug management: ? ?I ordered medication including Antivert for dizziness ?Reevaluation of the patient after these medicines showed that the patient improved ?I have reviewed the patients home medicines and have made adjustments as needed ? ? ?Test Considered: ? ?None ? ? ?Critical Interventions: ? ?None ? ? ?Consultations Obtained: ? ?No consult ? ?Problem List / ED Course: ? ?Vertigo ? ? ? ?Social Determinants of Health: ? ?None ? ? ? ? ? ? ?Diagnosis vertigo ?MRI was negative.  Patient improved with Antivert and fluids she will be discharged home with Antivert and will follow up with PCP ? ? ? ? ? ? ? ?Final Clinical Impression(s) / ED Diagnoses ?Final diagnoses:  ?Vertigo  ? ? ?Rx / DC Orders ?ED Discharge Orders   ? ?      Ordered  ?  meclizine (ANTIVERT) 25 MG  tablet  3 times daily PRN       ? 10/05/21 2147  ? ?  ?  ? ?  ? ? ?  ?Milton Ferguson, MD ?10/07/21 [phone] ? ?

## 2021-10-05 NOTE — ED Triage Notes (Addendum)
Off and on dizziness  for several weeks and today it has got worse.  FAST -.  Hx of muscular dystrophy.  Was seen by PCP 3 weeks ago given meds and got better then woke up this am with dizziness and difficulty ambulating.  ? ?Patient keeps repeating self and stating her head hurts. Generalized weakness and hx of TBI as well.   ?

## 2021-10-13 DIAGNOSIS — R42 Dizziness and giddiness: Secondary | ICD-10-CM | POA: Diagnosis not present

## 2021-10-13 DIAGNOSIS — Z0001 Encounter for general adult medical examination with abnormal findings: Secondary | ICD-10-CM | POA: Diagnosis not present

## 2021-10-13 DIAGNOSIS — E782 Mixed hyperlipidemia: Secondary | ICD-10-CM | POA: Diagnosis not present

## 2021-10-13 DIAGNOSIS — E7849 Other hyperlipidemia: Secondary | ICD-10-CM | POA: Diagnosis not present

## 2021-10-13 DIAGNOSIS — F329 Major depressive disorder, single episode, unspecified: Secondary | ICD-10-CM | POA: Diagnosis not present

## 2021-10-13 DIAGNOSIS — Z1331 Encounter for screening for depression: Secondary | ICD-10-CM | POA: Diagnosis not present

## 2021-10-13 DIAGNOSIS — E039 Hypothyroidism, unspecified: Secondary | ICD-10-CM | POA: Diagnosis not present

## 2021-10-13 DIAGNOSIS — Z6820 Body mass index (BMI) 20.0-20.9, adult: Secondary | ICD-10-CM | POA: Diagnosis not present

## 2021-10-27 ENCOUNTER — Other Ambulatory Visit (HOSPITAL_COMMUNITY): Payer: Self-pay | Admitting: Family Medicine

## 2021-10-27 DIAGNOSIS — Z1231 Encounter for screening mammogram for malignant neoplasm of breast: Secondary | ICD-10-CM

## 2021-11-03 DIAGNOSIS — F329 Major depressive disorder, single episode, unspecified: Secondary | ICD-10-CM | POA: Diagnosis not present

## 2021-11-03 DIAGNOSIS — E039 Hypothyroidism, unspecified: Secondary | ICD-10-CM | POA: Diagnosis not present

## 2021-11-03 DIAGNOSIS — E782 Mixed hyperlipidemia: Secondary | ICD-10-CM | POA: Diagnosis not present

## 2021-12-04 ENCOUNTER — Ambulatory Visit (HOSPITAL_COMMUNITY)
Admission: RE | Admit: 2021-12-04 | Discharge: 2021-12-04 | Disposition: A | Discharge status: Home / Self Care | Payer: Medicare HMO | Source: Ambulatory Visit | Attending: Family Medicine | Admitting: Family Medicine

## 2021-12-04 DIAGNOSIS — Z1231 Encounter for screening mammogram for malignant neoplasm of breast: Secondary | ICD-10-CM | POA: Insufficient documentation

## 2022-01-18 DIAGNOSIS — H04123 Dry eye syndrome of bilateral lacrimal glands: Secondary | ICD-10-CM | POA: Diagnosis not present

## 2022-04-02 DIAGNOSIS — U071 COVID-19: Secondary | ICD-10-CM | POA: Diagnosis not present

## 2022-04-02 DIAGNOSIS — J069 Acute upper respiratory infection, unspecified: Secondary | ICD-10-CM | POA: Diagnosis not present

## 2022-04-17 DIAGNOSIS — R1313 Dysphagia, pharyngeal phase: Secondary | ICD-10-CM | POA: Diagnosis not present

## 2022-04-17 DIAGNOSIS — R1310 Dysphagia, unspecified: Secondary | ICD-10-CM | POA: Diagnosis not present

## 2022-04-17 DIAGNOSIS — R471 Dysarthria and anarthria: Secondary | ICD-10-CM | POA: Diagnosis not present

## 2022-04-17 DIAGNOSIS — G7109 Other specified muscular dystrophies: Secondary | ICD-10-CM | POA: Diagnosis not present

## 2022-04-17 DIAGNOSIS — R1312 Dysphagia, oropharyngeal phase: Secondary | ICD-10-CM | POA: Diagnosis not present

## 2022-04-17 DIAGNOSIS — R413 Other amnesia: Secondary | ICD-10-CM | POA: Diagnosis not present

## 2022-08-16 DIAGNOSIS — M2011 Hallux valgus (acquired), right foot: Secondary | ICD-10-CM | POA: Diagnosis not present

## 2022-08-16 DIAGNOSIS — M2041 Other hammer toe(s) (acquired), right foot: Secondary | ICD-10-CM | POA: Diagnosis not present

## 2022-08-16 DIAGNOSIS — M2012 Hallux valgus (acquired), left foot: Secondary | ICD-10-CM | POA: Diagnosis not present

## 2022-08-16 DIAGNOSIS — M2042 Other hammer toe(s) (acquired), left foot: Secondary | ICD-10-CM | POA: Diagnosis not present

## 2022-08-22 DIAGNOSIS — K219 Gastro-esophageal reflux disease without esophagitis: Secondary | ICD-10-CM | POA: Diagnosis not present

## 2022-08-22 DIAGNOSIS — M21611 Bunion of right foot: Secondary | ICD-10-CM | POA: Diagnosis not present

## 2022-08-22 DIAGNOSIS — G7100 Muscular dystrophy, unspecified: Secondary | ICD-10-CM | POA: Diagnosis not present

## 2022-08-22 DIAGNOSIS — Z681 Body mass index (BMI) 19 or less, adult: Secondary | ICD-10-CM | POA: Diagnosis not present

## 2022-10-08 DIAGNOSIS — M2011 Hallux valgus (acquired), right foot: Secondary | ICD-10-CM | POA: Diagnosis not present

## 2022-10-08 DIAGNOSIS — T8484XA Pain due to internal orthopedic prosthetic devices, implants and grafts, initial encounter: Secondary | ICD-10-CM | POA: Diagnosis not present

## 2022-10-16 DIAGNOSIS — E782 Mixed hyperlipidemia: Secondary | ICD-10-CM | POA: Diagnosis not present

## 2022-10-16 DIAGNOSIS — K219 Gastro-esophageal reflux disease without esophagitis: Secondary | ICD-10-CM | POA: Diagnosis not present

## 2022-10-16 DIAGNOSIS — G7100 Muscular dystrophy, unspecified: Secondary | ICD-10-CM | POA: Diagnosis not present

## 2022-10-16 DIAGNOSIS — Z6820 Body mass index (BMI) 20.0-20.9, adult: Secondary | ICD-10-CM | POA: Diagnosis not present

## 2022-10-16 DIAGNOSIS — Z0001 Encounter for general adult medical examination with abnormal findings: Secondary | ICD-10-CM | POA: Diagnosis not present

## 2022-10-16 DIAGNOSIS — E039 Hypothyroidism, unspecified: Secondary | ICD-10-CM | POA: Diagnosis not present

## 2022-10-16 DIAGNOSIS — E7849 Other hyperlipidemia: Secondary | ICD-10-CM | POA: Diagnosis not present

## 2022-10-16 DIAGNOSIS — Z1331 Encounter for screening for depression: Secondary | ICD-10-CM | POA: Diagnosis not present

## 2022-10-30 DIAGNOSIS — M2021 Hallux rigidus, right foot: Secondary | ICD-10-CM | POA: Diagnosis not present

## 2022-10-30 DIAGNOSIS — T84293A Other mechanical complication of internal fixation device of bones of foot and toes, initial encounter: Secondary | ICD-10-CM | POA: Diagnosis not present

## 2022-10-30 DIAGNOSIS — T8484XA Pain due to internal orthopedic prosthetic devices, implants and grafts, initial encounter: Secondary | ICD-10-CM | POA: Diagnosis not present

## 2022-10-30 DIAGNOSIS — M2041 Other hammer toe(s) (acquired), right foot: Secondary | ICD-10-CM | POA: Diagnosis not present

## 2022-11-23 ENCOUNTER — Other Ambulatory Visit (HOSPITAL_COMMUNITY): Payer: Self-pay | Admitting: Family Medicine

## 2022-11-23 DIAGNOSIS — Z1231 Encounter for screening mammogram for malignant neoplasm of breast: Secondary | ICD-10-CM

## 2022-12-14 DIAGNOSIS — Z4889 Encounter for other specified surgical aftercare: Secondary | ICD-10-CM | POA: Diagnosis not present

## 2022-12-14 DIAGNOSIS — M2011 Hallux valgus (acquired), right foot: Secondary | ICD-10-CM | POA: Diagnosis not present

## 2022-12-17 ENCOUNTER — Ambulatory Visit (HOSPITAL_COMMUNITY)
Admission: RE | Admit: 2022-12-17 | Discharge: 2022-12-17 | Disposition: A | Discharge status: Home / Self Care | Payer: Medicare HMO | Source: Ambulatory Visit | Attending: Family Medicine | Admitting: Family Medicine

## 2022-12-17 DIAGNOSIS — Z1231 Encounter for screening mammogram for malignant neoplasm of breast: Secondary | ICD-10-CM | POA: Insufficient documentation

## 2023-01-17 DIAGNOSIS — Z4889 Encounter for other specified surgical aftercare: Secondary | ICD-10-CM | POA: Diagnosis not present

## 2023-01-17 DIAGNOSIS — M2011 Hallux valgus (acquired), right foot: Secondary | ICD-10-CM | POA: Diagnosis not present

## 2023-03-12 IMAGING — MR MR HEAD W/O CM
14 series · 48 of 48 positions shown · non-contrast
Comparison: MRI head 01/03/2016

CLINICAL DATA: Acute neuro deficit. Rule out stroke. Dizziness and
difficulty walking

EXAM:
MRI HEAD WITHOUT CONTRAST
TECHNIQUE: Multiplanar, multiecho pulse sequences of the brain and surrounding
structures were obtained without intravenous contrast.

[Series 9: DWI · axial · 3.0mm · 0.77mm/px · z∈[-51,+95]mm · 4 of 50 slices shown (1 of 6)]
[im 1/50]
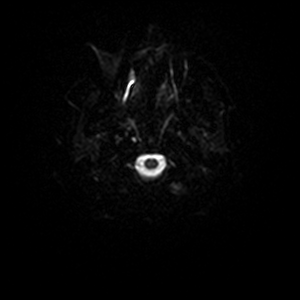
[im 17/50]
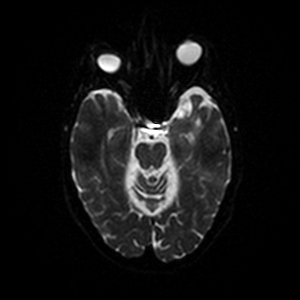
[im 33/50]
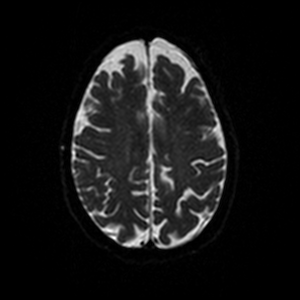
[im 50/50]
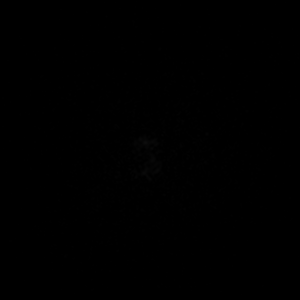

[Series 9: DWI · axial · 3.0mm · 0.77mm/px · z∈[-51,+95]mm · 3 of 50 slices shown (2 of 6)]
[im 1/50]
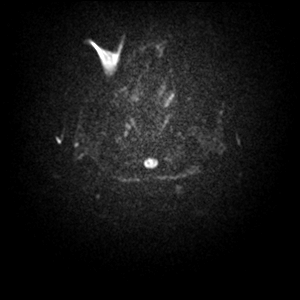
[im 25/50]
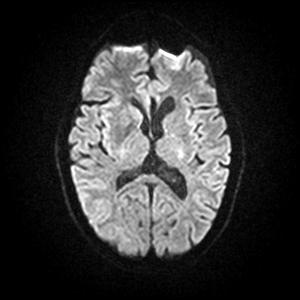
[im 50/50]
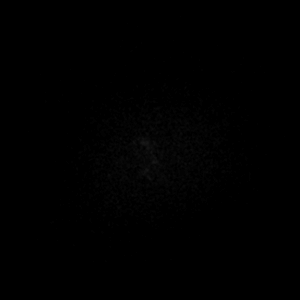

[Series 10: DWI · axial · 3.0mm · 0.77mm/px · z∈[-51,+95]mm · 3 of 50 slices shown (3 of 6)]
[im 1/50]
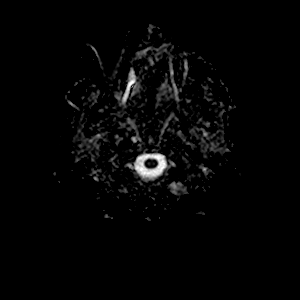
[im 25/50]
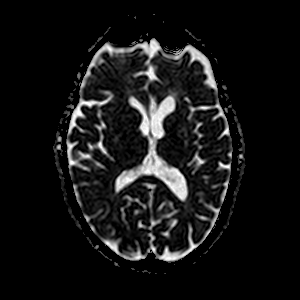
[im 50/50]
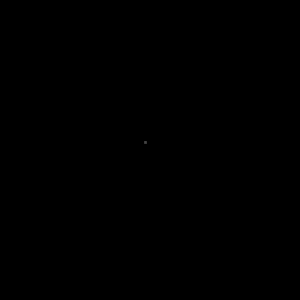

[Series 11: DWI · coronal · 5.0mm · 0.88mm/px · 2 of 28 slices shown (4 of 6)]
[im 1/28]
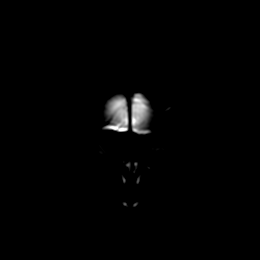
[im 28/28]
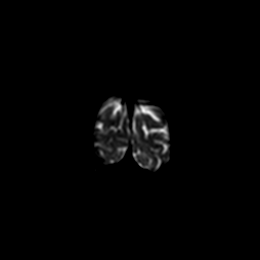

[Series 11: DWI · coronal · 5.0mm · 0.88mm/px · 2 of 28 slices shown (5 of 6)]
[im 1/28]
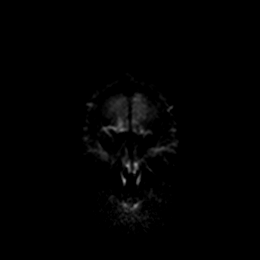
[im 28/28]
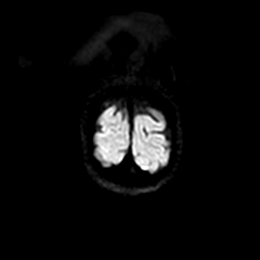

[Series 12: DWI · coronal · 5.0mm · 0.88mm/px · 2 of 27 slices shown (6 of 6)]
[im 1/27]
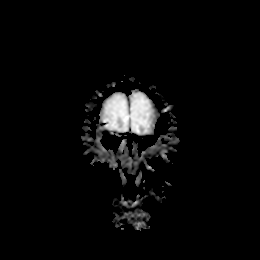
[im 27/27]
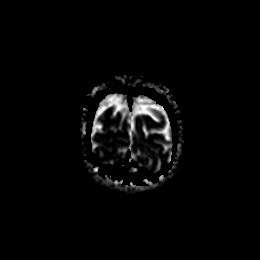

[Series 13: T1 · sagittal · 5.0mm · 0.75mm/px · 1 of 21 slices shown (1 of 2)]
[im 1/21]
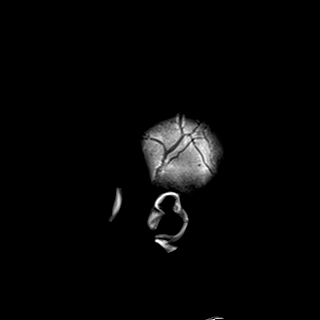

[Series 14: T2 · axial · 5.0mm · 0.72mm/px · z∈[-55,+99]mm · 2 of 23 slices shown (1 of 2)]
[im 1/23]
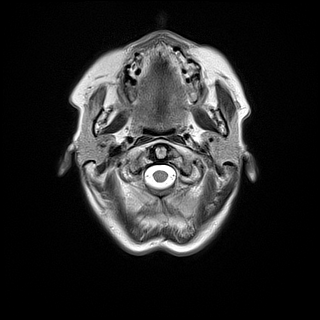
[im 23/23]
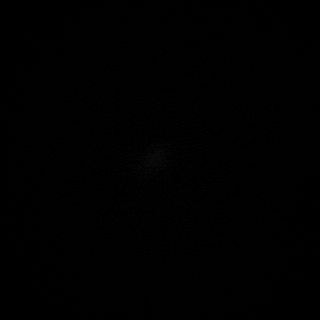

[Series 15: mag_images · axial · 3.0mm · 0.90mm/px · z∈[-65,+112]mm · 4 of 60 slices shown]
[im 1/60]
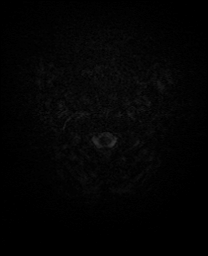
[im 20/60]
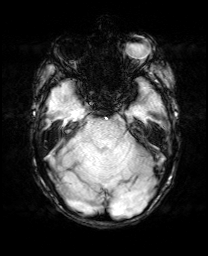
[im 40/60]
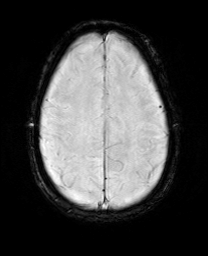
[im 60/60]
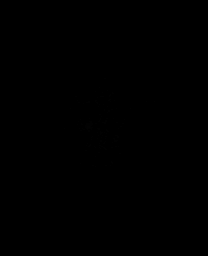

[Series 16: pha_images · axial · 3.0mm · 0.90mm/px · z∈[-65,+109]mm · 4 of 57 slices shown]
[im 1/57]
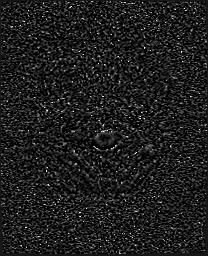
[im 19/57]
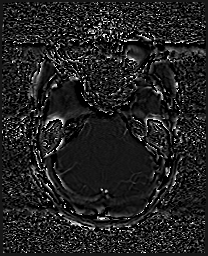
[im 38/57]
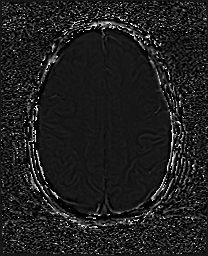
[im 57/57]
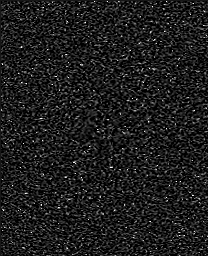

[Series 17: swi_images · axial · 3.0mm · 0.90mm/px · z∈[-65,+112]mm · 4 of 60 slices shown]
[im 1/60]
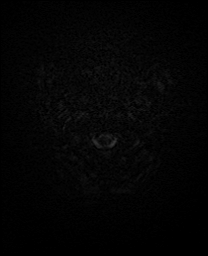
[im 20/60]
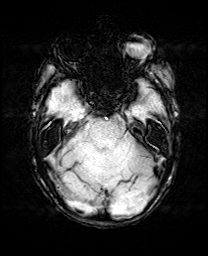
[im 40/60]
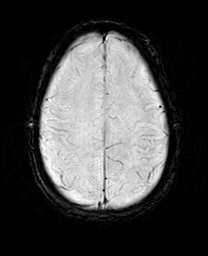
[im 60/60]
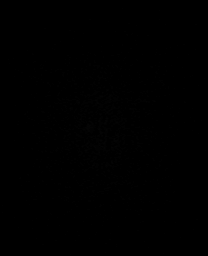

[Series 19: FLAIR · axial · 3.0mm · 0.45mm/px · z∈[-50,+97]mm · 3 of 50 slices shown]
[im 1/50]
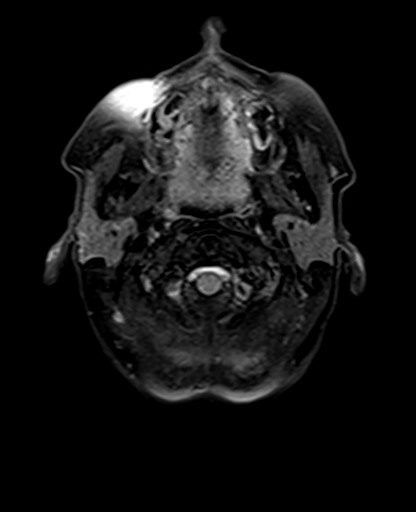
[im 25/50]
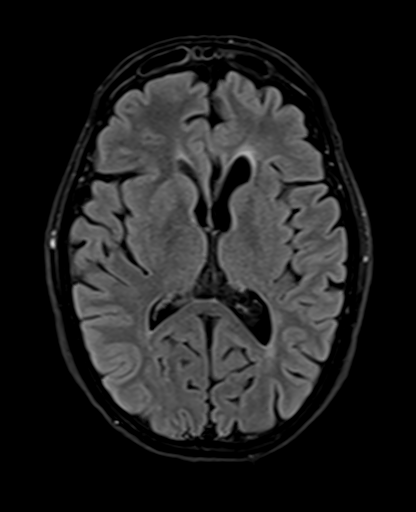
[im 50/50]
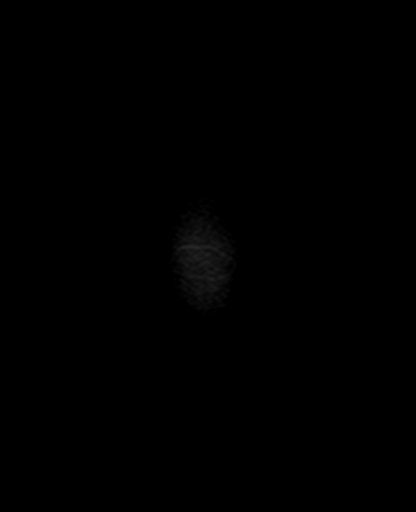

[Series 20: T1 · axial · 1.0mm · 0.98mm/px · z∈[-65,+109]mm · 12 of 168 slices shown (2 of 2)]
[im 1/168]
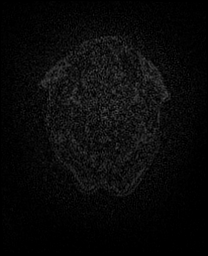
[im 16/168]
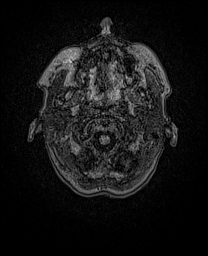
[im 31/168]
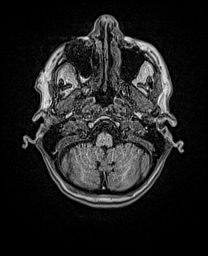
[im 46/168]
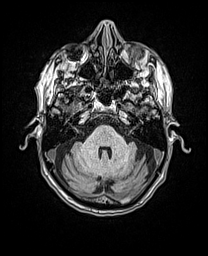
[im 61/168]
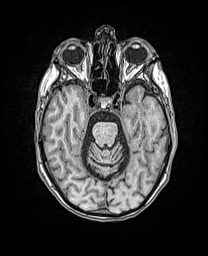
[im 76/168]
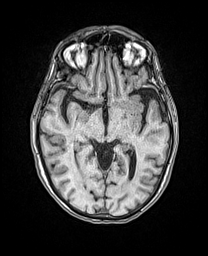
[im 92/168]
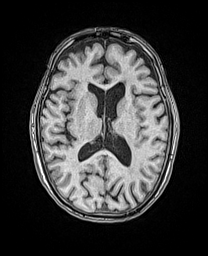
[im 107/168]
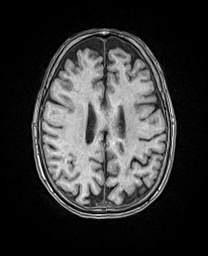
[im 122/168]
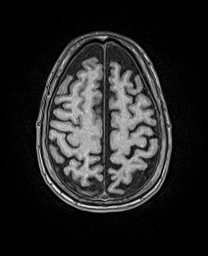
[im 137/168]
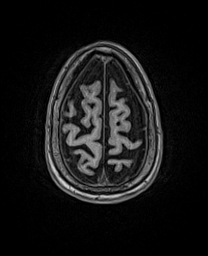
[im 152/168]
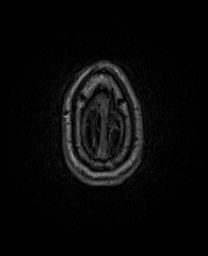
[im 168/168]
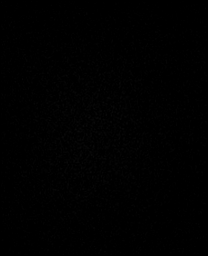

[Series 21: T2 · coronal · 5.0mm · 0.72mm/px · 2 of 28 slices shown (2 of 2)]
[im 1/28]
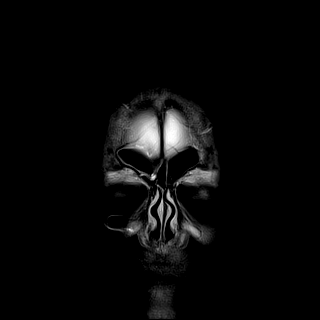
[im 28/28]
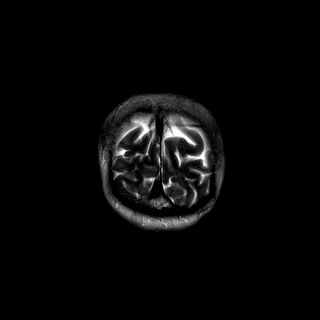

[48 of 48 positions shown; findings below may reference images not displayed]

FINDINGS: Brain: Negative for acute infarct.

Mild white matter changes with few small white matter
hyperintensities. Mild hyperintensity in the pons. Hyperintensity
left temporal lobe unchanged from 6147 most consistent with
infarction.

Vascular: Normal arterial flow voids.

Skull and upper cervical spine: No focal abnormality

Sinuses/Orbits: Mild mucosal edema paranasal sinuses. Negative orbit

Other: None
IMPRESSION: Negative for acute infarct.  Mild chronic ischemic change.

## 2023-04-09 ENCOUNTER — Encounter: Payer: Self-pay | Admitting: *Deleted

## 2023-04-17 ENCOUNTER — Telehealth: Payer: Self-pay | Admitting: Internal Medicine

## 2023-04-17 DIAGNOSIS — H04123 Dry eye syndrome of bilateral lacrimal glands: Secondary | ICD-10-CM | POA: Diagnosis not present

## 2023-04-17 NOTE — Telephone Encounter (Signed)
Questionnaire in review

## 2023-04-18 DIAGNOSIS — G7109 Other specified muscular dystrophies: Secondary | ICD-10-CM | POA: Diagnosis not present

## 2023-04-18 DIAGNOSIS — R1313 Dysphagia, pharyngeal phase: Secondary | ICD-10-CM | POA: Diagnosis not present

## 2023-04-18 DIAGNOSIS — R1312 Dysphagia, oropharyngeal phase: Secondary | ICD-10-CM | POA: Diagnosis not present

## 2023-04-25 NOTE — Telephone Encounter (Signed)
  Procedure: colonoscopy  Height: 5'1 Weight: 109lbs       Have you had a colonoscopy before?  07/25/16, Dr. Jena Gauss  Do you have family history of colon cancer?  no  Do you have a family history of polyps? no  Previous colonoscopy with polyps removed? no  Do you have a history colorectal cancer?   no  Are you diabetic?  no  Do you have a prosthetic or mechanical heart valve? no  Do you have a pacemaker/defibrillator?   no  Have you had endocarditis/atrial fibrillation?  no  Do you use supplemental oxygen/CPAP?  no  Have you had joint replacement within the last 12 months?  no  Do you tend to be constipated or have to use laxatives?  no   Do you have history of alcohol use? If yes, how much and how often.  no  Do you have history or are you using drugs? If yes, what do are you  using?  no  Have you ever had a stroke/heart attack?  no  Have you ever had a heart or other vascular stent placed,?no  Do you take weight loss medication? no  female patients,: have you had a hysterectomy? no                              are you post menopausal?                                do you still have your menstrual cycle? no    Date of last menstrual period?   Do you take any blood-thinning medications such as: (Plavix, aspirin, Coumadin, Aggrenox, Brilinta, Xarelto, Eliquis, Pradaxa, Savaysa or Effient)? no  If yes we need the name, milligram, dosage and who is prescribing doctor:               Current Outpatient Medications  Medication Sig Dispense Refill   alendronate (FOSAMAX) 70 MG tablet As directed     atorvastatin (LIPITOR) 20 MG tablet Take 20 mg by mouth daily.      escitalopram (LEXAPRO) 20 MG tablet Take 20 mg by mouth daily.     esomeprazole (NEXIUM) 20 MG capsule Take 20 mg by mouth daily.     levothyroxine (SYNTHROID, LEVOTHROID) 100 MCG tablet Take 100 mcg by mouth daily before breakfast.     No current facility-administered medications for this visit.     Allergies  Allergen Reactions   Ibuprofen Swelling and Anaphylaxis    Throat and lips were swelling

## 2023-04-25 NOTE — Addendum Note (Signed)
Addended by: Armstead Peaks on: 04/25/2023 01:51 PM   Modules accepted: Orders

## 2023-05-09 ENCOUNTER — Encounter: Payer: Self-pay | Admitting: *Deleted

## 2023-05-09 DIAGNOSIS — E782 Mixed hyperlipidemia: Secondary | ICD-10-CM | POA: Diagnosis not present

## 2023-05-09 DIAGNOSIS — G7100 Muscular dystrophy, unspecified: Secondary | ICD-10-CM | POA: Diagnosis not present

## 2023-05-09 DIAGNOSIS — Z6820 Body mass index (BMI) 20.0-20.9, adult: Secondary | ICD-10-CM | POA: Diagnosis not present

## 2023-05-09 DIAGNOSIS — B360 Pityriasis versicolor: Secondary | ICD-10-CM | POA: Diagnosis not present

## 2023-05-09 DIAGNOSIS — E039 Hypothyroidism, unspecified: Secondary | ICD-10-CM | POA: Diagnosis not present

## 2023-05-09 NOTE — Telephone Encounter (Signed)
Looked back at 2017 TCS she is not due for 10 years. She has a recall in for 2028. Called pt and spoke with spouse to see if they knew why she needed a sooner TCS. They did not, just said PCP said she was due. Advised she is not due per last TCS.

## 2023-05-09 NOTE — Telephone Encounter (Signed)
Letter sent to PCP advising patient not due until 07/2026, referral has been clsoed

## 2023-08-21 ENCOUNTER — Other Ambulatory Visit: Payer: Self-pay

## 2023-08-21 ENCOUNTER — Encounter (HOSPITAL_COMMUNITY): Payer: Self-pay

## 2023-08-21 ENCOUNTER — Ambulatory Visit (HOSPITAL_COMMUNITY)
Admission: RE | Admit: 2023-08-21 | Discharge: 2023-08-21 | Disposition: A | Discharge status: Home / Self Care | Payer: Medicare HMO | Source: Ambulatory Visit | Attending: Internal Medicine | Admitting: Internal Medicine

## 2023-08-21 ENCOUNTER — Other Ambulatory Visit (HOSPITAL_COMMUNITY): Payer: Self-pay | Admitting: Internal Medicine

## 2023-08-21 ENCOUNTER — Inpatient Hospital Stay (HOSPITAL_COMMUNITY)
Admission: EM | Admit: 2023-08-21 | Discharge: 2023-08-30 | DRG: 398 | Disposition: A | Discharge status: Home / Self Care | Payer: Medicare HMO | Source: Ambulatory Visit | Attending: Internal Medicine | Admitting: Internal Medicine

## 2023-08-21 DIAGNOSIS — K35211 Acute appendicitis with generalized peritonitis, with perforation and abscess: Secondary | ICD-10-CM | POA: Diagnosis not present

## 2023-08-21 DIAGNOSIS — K567 Ileus, unspecified: Secondary | ICD-10-CM | POA: Diagnosis not present

## 2023-08-21 DIAGNOSIS — Z820 Family history of epilepsy and other diseases of the nervous system: Secondary | ICD-10-CM

## 2023-08-21 DIAGNOSIS — Z79890 Hormone replacement therapy (postmenopausal): Secondary | ICD-10-CM | POA: Diagnosis not present

## 2023-08-21 DIAGNOSIS — K9189 Other postprocedural complications and disorders of digestive system: Secondary | ICD-10-CM | POA: Diagnosis not present

## 2023-08-21 DIAGNOSIS — K5900 Constipation, unspecified: Secondary | ICD-10-CM | POA: Diagnosis not present

## 2023-08-21 DIAGNOSIS — E785 Hyperlipidemia, unspecified: Secondary | ICD-10-CM | POA: Diagnosis not present

## 2023-08-21 DIAGNOSIS — Z79899 Other long term (current) drug therapy: Secondary | ICD-10-CM | POA: Diagnosis not present

## 2023-08-21 DIAGNOSIS — K3533 Acute appendicitis with perforation and localized peritonitis, with abscess: Principal | ICD-10-CM | POA: Diagnosis present

## 2023-08-21 DIAGNOSIS — Z7983 Long term (current) use of bisphosphonates: Secondary | ICD-10-CM

## 2023-08-21 DIAGNOSIS — G7100 Muscular dystrophy, unspecified: Secondary | ICD-10-CM | POA: Diagnosis not present

## 2023-08-21 DIAGNOSIS — K651 Peritoneal abscess: Secondary | ICD-10-CM | POA: Diagnosis not present

## 2023-08-21 DIAGNOSIS — K219 Gastro-esophageal reflux disease without esophagitis: Secondary | ICD-10-CM | POA: Diagnosis not present

## 2023-08-21 DIAGNOSIS — K3532 Acute appendicitis with perforation and localized peritonitis, without abscess: Secondary | ICD-10-CM | POA: Diagnosis not present

## 2023-08-21 DIAGNOSIS — Z87891 Personal history of nicotine dependence: Secondary | ICD-10-CM

## 2023-08-21 DIAGNOSIS — Z886 Allergy status to analgesic agent status: Secondary | ICD-10-CM | POA: Diagnosis not present

## 2023-08-21 DIAGNOSIS — N3289 Other specified disorders of bladder: Secondary | ICD-10-CM | POA: Diagnosis not present

## 2023-08-21 DIAGNOSIS — E782 Mixed hyperlipidemia: Secondary | ICD-10-CM | POA: Diagnosis not present

## 2023-08-21 DIAGNOSIS — F32A Depression, unspecified: Secondary | ICD-10-CM | POA: Diagnosis not present

## 2023-08-21 DIAGNOSIS — Z8249 Family history of ischemic heart disease and other diseases of the circulatory system: Secondary | ICD-10-CM | POA: Diagnosis not present

## 2023-08-21 DIAGNOSIS — E039 Hypothyroidism, unspecified: Secondary | ICD-10-CM | POA: Diagnosis not present

## 2023-08-21 DIAGNOSIS — R1030 Lower abdominal pain, unspecified: Secondary | ICD-10-CM

## 2023-08-21 DIAGNOSIS — E7849 Other hyperlipidemia: Secondary | ICD-10-CM

## 2023-08-21 DIAGNOSIS — I700 Atherosclerosis of aorta: Secondary | ICD-10-CM | POA: Diagnosis present

## 2023-08-21 DIAGNOSIS — Z6820 Body mass index (BMI) 20.0-20.9, adult: Secondary | ICD-10-CM | POA: Diagnosis not present

## 2023-08-21 DIAGNOSIS — K3531 Acute appendicitis with localized peritonitis and gangrene, without perforation: Secondary | ICD-10-CM | POA: Diagnosis not present

## 2023-08-21 DIAGNOSIS — G7109 Other specified muscular dystrophies: Secondary | ICD-10-CM | POA: Diagnosis not present

## 2023-08-21 DIAGNOSIS — M810 Age-related osteoporosis without current pathological fracture: Secondary | ICD-10-CM | POA: Diagnosis present

## 2023-08-21 DIAGNOSIS — K353 Acute appendicitis with localized peritonitis, without perforation or gangrene: Secondary | ICD-10-CM | POA: Diagnosis not present

## 2023-08-21 DIAGNOSIS — R188 Other ascites: Secondary | ICD-10-CM | POA: Diagnosis not present

## 2023-08-21 LAB — CBC WITH DIFFERENTIAL/PLATELET
Abs Immature Granulocytes: 0.06 10*3/uL (ref 0.00–0.07)
Basophils Absolute: 0.1 10*3/uL (ref 0.0–0.1)
Basophils Relative: 0 %
Eosinophils Absolute: 0.1 10*3/uL (ref 0.0–0.5)
Eosinophils Relative: 0 %
HCT: 40.7 % (ref 36.0–46.0)
Hemoglobin: 13.7 g/dL (ref 12.0–15.0)
Immature Granulocytes: 0 %
Lymphocytes Relative: 9 %
Lymphs Abs: 1.4 10*3/uL (ref 0.7–4.0)
MCH: 30.5 pg (ref 26.0–34.0)
MCHC: 33.7 g/dL (ref 30.0–36.0)
MCV: 90.6 fL (ref 80.0–100.0)
Monocytes Absolute: 0.9 10*3/uL (ref 0.1–1.0)
Monocytes Relative: 6 %
Neutro Abs: 13.4 10*3/uL — ABNORMAL HIGH (ref 1.7–7.7)
Neutrophils Relative %: 85 %
Platelets: 323 10*3/uL (ref 150–400)
RBC: 4.49 MIL/uL (ref 3.87–5.11)
RDW: 12.6 % (ref 11.5–15.5)
WBC: 15.8 10*3/uL — ABNORMAL HIGH (ref 4.0–10.5)
nRBC: 0 % (ref 0.0–0.2)

## 2023-08-21 LAB — COMPREHENSIVE METABOLIC PANEL
ALT: 28 U/L (ref 0–44)
AST: 31 U/L (ref 15–41)
Albumin: 3.4 g/dL — ABNORMAL LOW (ref 3.5–5.0)
Alkaline Phosphatase: 81 U/L (ref 38–126)
Anion gap: 7 (ref 5–15)
BUN: 9 mg/dL (ref 8–23)
CO2: 28 mmol/L (ref 22–32)
Calcium: 9.3 mg/dL (ref 8.9–10.3)
Chloride: 99 mmol/L (ref 98–111)
Creatinine, Ser: 0.57 mg/dL (ref 0.44–1.00)
GFR, Estimated: >60 mL/min (ref >60)
Glucose, Bld: 108 mg/dL — ABNORMAL HIGH (ref 70–99)
Potassium: 3.9 mmol/L (ref 3.5–5.1)
Sodium: 134 mmol/L — ABNORMAL LOW (ref 135–145)
Total Bilirubin: 0.7 mg/dL (ref 0.0–1.2)
Total Protein: 7.1 g/dL (ref 6.5–8.1)

## 2023-08-21 LAB — LIPASE, BLOOD: Lipase: 32 U/L (ref 11–51)

## 2023-08-21 LAB — I-STAT CG4 LACTIC ACID, ED: Lactic Acid, Venous: 0.7 mmol/L (ref 0.5–1.9)

## 2023-08-21 MED ORDER — PIPERACILLIN-TAZOBACTAM 3.375 G IVPB
3.3750 g | Freq: Three times a day (TID) | INTRAVENOUS | Status: DC
  Administered 2023-08-22 – 2023-08-30 (×25): 3.375 g via INTRAVENOUS
  Filled 2023-08-21 (×26): qty 50

## 2023-08-21 MED ORDER — LACTATED RINGERS IV SOLN
INTRAVENOUS | Status: DC
Start: 2023-08-21 — End: 2023-08-21

## 2023-08-21 MED ORDER — IOHEXOL 300 MG/ML  SOLN
100.0000 mL | Freq: Once | INTRAMUSCULAR | Status: AC | PRN
Start: 2023-08-21 — End: 2023-08-21
  Administered 2023-08-21: 100 mL via INTRAVENOUS

## 2023-08-21 MED ORDER — SODIUM CHLORIDE 0.9 % IV SOLN: Freq: Once | INTRAVENOUS | Status: AC

## 2023-08-21 MED ORDER — FENTANYL CITRATE PF 50 MCG/ML IJ SOSY
25.0000 ug | PREFILLED_SYRINGE | INTRAMUSCULAR | Status: DC | PRN
Start: 2023-08-21 — End: 2023-08-27
  Administered 2023-08-21 – 2023-08-26 (×2): 25 ug via INTRAVENOUS
  Filled 2023-08-21 (×2): qty 1

## 2023-08-21 MED ORDER — DEXTROSE IN LACTATED RINGERS 5 % IV SOLN: INTRAVENOUS | Status: AC

## 2023-08-21 MED ORDER — PIPERACILLIN-TAZOBACTAM 3.375 G IVPB 30 MIN
3.3750 g | Freq: Once | INTRAVENOUS | Status: AC
  Administered 2023-08-21: 3.375 g via INTRAVENOUS
  Filled 2023-08-21: qty 50

## 2023-08-21 NOTE — Progress Notes (Signed)
 Pharmacy Antibiotic Note  [name] is a 73 y.o. female admitted on 08/21/2023 presenting with perforated appendicitis.  Pharmacy has been consulted for zosyn  dosing.  Plan: Zosyn  3.375g IV q 8h (extended 4h infusion) Monitor renal function, surgical plans and LOT  Height: 5\' 1"  (154.9 cm) Weight: 49 kg (108 lb) IBW/kg (Calculated) : 47.8  Temp (24hrs), Avg:98.7 F (37.1 C), Min:98.7 F (37.1 C), Max:98.7 F (37.1 C)  Recent Labs  Lab 08/21/23 1935 08/21/23 1946  WBC 15.8*  --   CREATININE 0.57  --   LATICACIDVEN  --  0.7    Estimated Creatinine Clearance: 48 mL/min (by C-G formula based on SCr of 0.57 mg/dL).    Allergies  Allergen Reactions   Advil [Ibuprofen] Anaphylaxis and Swelling    Throat, lip swelling    Trinidad Funk, PharmD, [facility] Clinical Pharmacist ED Pharmacist Phone # [phone] 08/21/2023 10:22 PM

## 2023-08-21 NOTE — ED Notes (Signed)
 Surgery at bedside.

## 2023-08-21 NOTE — ED Provider Notes (Signed)
 Emergency Department Provider Note   I have reviewed the triage vital signs and the nursing notes.   HISTORY  Chief Complaint Abdominal Pain   HPI [name] is a 73 y.o. female with past history reviewed below presents to the emergency department for evaluation of abdominal pain and abnormal outpatient CT.  Patient began having right flank and abdominal pain on Monday.  Symptoms progressed to the point where she saw her PCP who ordered an outpatient CT scan of the abdomen and pelvis.  Those results were called to her today describing acute, perforated appendicitis.  No fevers.  Pain well-controlled.  No vomiting or diarrhea.   Past Medical History:  Diagnosis Date   Broken arm    right   GERD (gastroesophageal reflux disease)    Hyperlipidemia    Hypothyroidism    Muscular dystrophy (HCC)    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HCC)     Review of Systems  Constitutional: No fever/chills Cardiovascular: Denies chest pain. Respiratory: Denies shortness of breath. Gastrointestinal: Positive right abdominal pain.  No nausea, no vomiting.  No diarrhea.  Skin: Negative for rash. Neurological: Negative for headaches.  ____________________________________________   PHYSICAL EXAM:  VITAL SIGNS: ED Triage Vitals  Encounter Vitals Group     BP 08/21/23 1849 (!) 150/67     Pulse Rate 08/21/23 1849 72     Resp 08/21/23 1849 16     Temp 08/21/23 1849 98.7 F (37.1 C)     Temp Source 08/21/23 1849 Oral     SpO2 08/21/23 1849 100 %     Weight 08/21/23 1850 108 lb (49 kg)     Height 08/21/23 1850 5\' 1"  (1.549 m)   Constitutional: Alert. Well appearing and in no acute distress. Eyes: Conjunctivae are normal.  Head: Atraumatic. Nose: No congestion/rhinnorhea. Mouth/Throat: Mucous membranes are moist.   Neck: No stridor.   Cardiovascular: Normal rate, regular rhythm. Good peripheral circulation. Grossly normal heart sounds.   Respiratory: Normal respiratory effort.   No retractions. Lungs CTAB. Gastrointestinal: Soft with diffuse tenderness, worse in the RLQ with some voluntary guarding. No distention.  Musculoskeletal: No lower extremity tenderness nor edema. No gross deformities of extremities. Neurologic:  Normal speech and language.  Skin:  Skin is warm, dry and intact. No rash noted.  ____________________________________________   LABS (all labs ordered are listed, but only abnormal results are displayed)  Labs Reviewed  COMPREHENSIVE METABOLIC PANEL - Abnormal; Notable for the following components:      Result Value   Sodium 134 (*)    Glucose, Bld 108 (*)    Albumin 3.4 (*)    All other components within normal limits  CBC WITH DIFFERENTIAL/PLATELET - Abnormal; Notable for the following components:   WBC 15.8 (*)    Neutro Abs 13.4 (*)    All other components within normal limits  CULTURE, BLOOD (ROUTINE X 2)  CULTURE, BLOOD (ROUTINE X 2)  LIPASE, BLOOD  COMPREHENSIVE METABOLIC PANEL  CBC  I-STAT CG4 LACTIC ACID, ED   ____________________________________________  RADIOLOGY  CT ABDOMEN PELVIS W CONTRAST Result Date: 08/21/2023 CLINICAL DATA:  Lower abdominal pain. EXAM: CT ABDOMEN AND PELVIS WITH CONTRAST TECHNIQUE: Multidetector CT imaging of the abdomen and pelvis was performed using the standard protocol following bolus administration of intravenous contrast. RADIATION DOSE REDUCTION: This exam was performed according to the departmental dose-optimization program which includes automated exposure control, adjustment of the mA and/or kV according to patient size and/or use of iterative reconstruction technique. CONTRAST:  OMNIPAQUE  IOHEXOL  300 MG/ML  SOLN COMPARISON:  None Available. FINDINGS: Lower chest: The visualized lung bases are clear. No intra-abdominal free air.  Small free fluid in the pelvis. Hepatobiliary: The liver is unremarkable. No biliary dilatation. The gallbladder is unremarkable. Pancreas: Unremarkable. No  pancreatic ductal dilatation or surrounding inflammatory changes. Spleen: Normal in size without focal abnormality. Adrenals/Urinary Tract: The adrenal glands are unremarkable. The kidneys, visualized ureters, and urinary bladder appear unremarkable. Stomach/Bowel: There is no bowel obstruction. There is inflammatory changes in the right hemipelvis surrounding the appendix. The appendix is only partially visualized due to extensive inflammatory changes. The visualized proximal appendix appears thickened and inflamed measuring approximately 12 mm in diameter. There is partially organized small fluid collection adjacent to the appendix measuring approximately 2.3 x 1.1 cm most consistent with developing abscess. The appendix is located in the right hemipelvis and extends posteriorly along the right adnexa. Findings most consistent with perforated acute appendicitis with developing abscesses. There is reactive inflammation and thickening of the terminal ileum. Vascular/Lymphatic: There is mild atherosclerotic calcification of the abdominal aorta. The IVC is unremarkable. No portal venous gas. There is no adenopathy. Reproductive: The uterus is grossly unremarkable. No suspicious adnexal masses. Other: None Musculoskeletal: Osteopenia with scoliosis and degenerative changes of the spine. No acute osseous pathology. IMPRESSION: 1. Findings most consistent with perforated acute appendicitis with developing abscesses. 2.  Aortic Atherosclerosis (ICD10-I70.0). Electronically Signed   By: Angus Bark M.D.   On: 08/21/2023 17:17    ____________________________________________   PROCEDURES  Procedure(s) performed:   Procedures  CRITICAL CARE Performed by: Roberts Ching Total critical care time: 35 minutes Critical care time was exclusive of separately billable procedures and treating other patients. Critical care was necessary to treat or prevent imminent or life-threatening deterioration. Critical care  was time spent personally by me on the following activities: development of treatment plan with patient and/or surrogate as well as nursing, discussions with consultants, evaluation of patient's response to treatment, examination of patient, obtaining history from patient or surrogate, ordering and performing treatments and interventions, ordering and review of laboratory studies, ordering and review of radiographic studies, pulse oximetry and re-evaluation of patient's condition.  Abby Hocking, MD Emergency Medicine  ____________________________________________   INITIAL IMPRESSION / ASSESSMENT AND PLAN / ED COURSE  Pertinent labs & imaging results that were available during my care of the patient were reviewed by me and considered in my medical decision making (see chart for details).   This patient is Presenting for Evaluation of abdominal pain, which does require a range of treatment options, and is a complaint that involves a high risk of morbidity and mortality.  The Differential Diagnoses includes but is not exclusive to acute coronary syndrome, aortic dissection, pulmonary embolism, cardiac tamponade, community-acquired pneumonia, pericarditis, musculoskeletal chest wall pain, etc.   Critical Interventions-    Medications  dextrose  5 % in lactated ringers  infusion ( Intravenous New Bag/Given 08/21/23 2245)  piperacillin -tazobactam (ZOSYN ) IVPB 3.375 g (has no administration in time range)  fentaNYL  (SUBLIMAZE ) injection 25 mcg (25 mcg Intravenous Given 08/21/23 2245)  piperacillin -tazobactam (ZOSYN ) IVPB 3.375 g (0 g Intravenous Stopped 08/21/23 2103)  0.9 %  sodium chloride  infusion (0 mLs Intravenous Stopped 08/21/23 2241)    Reassessment after intervention: pain improved.    I did obtain Additional Historical Information from husband at bedside.   I decided to review pertinent External Data, and in summary patient with outpatient CT from today which I independently interpreted  and agree  with radiology.   Clinical Laboratory Tests Ordered, included CBC with leukocytosis to 15.8.  Lactic acid normal.  No acute kidney injury.  Cardiac Monitor Tracing which shows NSR.    Social Determinants of Health Risk patient is a non-smoker.   Consult complete with General Surgery, Dr. Ramiro Burly. Plan for medicine admit and IR consideration in the AM.   TRH. Plan for admit.   Medical Decision Making: Summary:  The patient presents to the emergency department with right side abdominal pain and tenderness on exam.  Outpatient CT showing perforated appendicitis.  General surgery consulted as above.  Recommend medicine admit and consideration of IR in the morning but possible abscess is very small.   Reevaluation with update and discussion with patient and husband. They are in agreement with plan for admit.   Patient's presentation is most consistent with acute presentation with potential threat to life or bodily function.   Disposition: admit  ____________________________________________  FINAL CLINICAL IMPRESSION(S) / ED DIAGNOSES  Final diagnoses:  Acute appendicitis with perforation and localized peritonitis, without gangrene, unspecified whether abscess present    Note:  This document was prepared using Dragon voice recognition software and may include unintentional dictation errors.  Abby Hocking, MD, [facility] Emergency Medicine    Daylin Eads, Shereen Dike, MD 08/21/23 [phone]

## 2023-08-21 NOTE — Consult Note (Signed)
 Reason for Consult:  Perforated appendicitis Referring Provider: Abby Hocking, MD  HPI  [name] is an 73 y.o. female with history of GERD, HLD, hypothyroidism, muscular dystrophy who presents to ED for perforated appendicitis.  Patient is accompanied by her husband at bedside. She states that she started to have abdominal pain on Monday.  She says that it was located in the right lower quadrant and has progressively gotten worse.  She also reports some referred right back pain.  She denies fevers and chills as well as nausea and vomiting.  She does state that she has had somewhat of a decreased appetite, however was able to eat around 11 AM today.  Patient also states that she typically has daily bowel movements, but has not had a bowel movement since Monday.  She went to her PCP this morning due to the persistent pain and an outpatient CT scan was ordered.  This showed perforated appendicitis with a developing abscess measuring 2.3 x 1.1 cm.  Labs notable for leukocytosis to 15.8 with left shift.  10 point review of systems is negative except as listed above in HPI.  Objective  Past Medical History: Past Medical History:  Diagnosis Date   Broken arm    right   GERD (gastroesophageal reflux disease)    Hyperlipidemia    Hypothyroidism    Muscular dystrophy (HCC)    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HCC)     Past Surgical History: Past Surgical History:  Procedure Laterality Date   BELPHAROPTOSIS REPAIR Bilateral    COLONOSCOPY  Oc 2005   normal colonoscopy   COLONOSCOPY N/A 07/25/2016   Procedure: COLONOSCOPY;  Surgeon: Suzette Espy, MD;  Location: [facility];  Service: Endoscopy;  Laterality: N/A;  9:15 AM   pH and manometry   04/28/2002   RMR: Nonspecific motility disorder, DeMeester score 5.3, adequate control of reflux   throat muscle removed      Family History:  Family History  Problem Relation Age of Onset   Hypothyroidism Mother    Varicose Veins  Mother    Heart disease Mother    Muscular dystrophy Father    Obesity Sister    Muscular dystrophy Brother    Obesity Brother    Obesity Brother    Colon cancer Neg Hx     Social History:  reports that she has quit smoking. She has never used smokeless tobacco. She reports that she does not drink alcohol and does not use drugs.  Allergies:  Allergies  Allergen Reactions   Advil [Ibuprofen] Anaphylaxis and Swelling    Throat, lip swelling    Medications: I have reviewed the patient's current medications.  Labs: I have personally reviewed all labs for the past 24h  Imaging: I have personally reviewed and interpreted all imaging for the past 24h and agree with the radiologist's impression.  CT ABDOMEN PELVIS W CONTRAST Result Date: 08/21/2023 FINDINGS: No intra-abdominal free air.  Small free fluid in the pelvis. There is inflammatory changes in the right hemipelvis surrounding the appendix. The appendix is only partially visualized due to extensive inflammatory changes. The visualized proximal appendix appears thickened and inflamed measuring approximately 12 mm in diameter. There is partially organized small fluid collection adjacent to the appendix measuring approximately 2.3 x 1.1 cm most consistent with developing abscess. The appendix is located in the right hemipelvis and extends posteriorly along the right adnexa. Findings most consistent with perforated acute appendicitis with developing abscesses. There is reactive inflammation and  thickening of the terminal ileum.    Physical Exam Blood pressure (!) 150/67, pulse 72, temperature 98.7 F (37.1 C), temperature source Oral, resp. rate 16, height 5\' 1"  (1.549 m), weight 49 kg, SpO2 100%. Constitutional: well-developed, well-nourished HEENT: pupils equal, round, reactive to light, moist conjunctiva, hearing intact Oropharynx: mucous membranes dry CV: Regular rate and rhythm, normotensive Chest: equal chest rise bilaterally  normal respiratory effort on room air Abdomen: soft, nondistended, tender to palpation in right lower quadrant Rectal: deferred Extremities: moves all extremities, no peripheral edema Skin: warm, dry, no rashes Psych: normal memory, normal mood/affect  Neuro: No focal neurologic deficits, A&Ox3    Assessment   [name] is an 73 y.o. female who presents to [facility] on 1/15 for perforated appendicitis found on outpatient CT imaging   Plan  - Recommend medicine admission - Agree with zosyn  - Okay for clears until midnight then NPO, IVF. - On review of imaging, abscess is ill defined and relatively small, Anticipate too small/ill defined for IR drainage at this time but reasonable to discuss with their team in AM to review imaging. If it is not amenable to drainage would consider rescanning in coming days to see if better defined/larger. - If patient does not improve with antibiotics may need operation during this hospitalization. Briefly discussed laparoscopic appendectomy with patient but that given perforation this potentially increases chance of damage to surrounding structures or requiring a more invasive operation such as an ileocecectomy. We also discussed that if nonoperative management is successful this hospitalization then we would see patient in clinic in several weeks and discuss possible interval appendectomy at that time.  All patient's and husband's questions answered.  I reviewed ED provider notes, last 24 h vitals and pain scores, last 48 h intake and output, last 24 h labs and trends, and last 24 h imaging results. Discussed recommendations directly with EDP, Dr. Dolan Freiberg.  I spent a total of 65 minutes in both face-to-face and non-face-to-face activities, excluding procedures performed, for this visit on the date of this encounter. I personally reviewed all labs and imaging and personally interpreted any imaging.    Freddrick Jaffe, MD [facility] Surgery

## 2023-08-21 NOTE — H&P (Signed)
History and Physical    [name] DOB: 1951-01-09 DOA: 08/21/2023  Patient coming from: Home.  Chief Complaint: Abdominal pain.  HPI: [name] is a 73 y.o. female with history of hyperlipidemia and hypothyroidism and cognitive issues has been experiencing abdominal pain for the last 2 days.  Denies any nausea vomiting or diarrhea.  Pain is mostly in the lower quadrants across the abdomen.  Patient had followed up with her primary care physician who did an outpatient CT scan which shows possible perforated appendix with abscess formation.  Patient was referred to the ER.  ED Course: In the ER patient's labs show leukocytosis.  On exam patient has significant abdominal diffuse tenderness.  General surgery was consulted patient was started on antibiotics and at this time planning conservative measures and hospitalist requested admission.  Review of Systems: As per HPI, rest all negative.   Past Medical History:  Diagnosis Date   Broken arm    right   GERD (gastroesophageal reflux disease)    Hyperlipidemia    Hypothyroidism    Muscular dystrophy (HCC)    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HCC)     Past Surgical History:  Procedure Laterality Date   BELPHAROPTOSIS REPAIR Bilateral    COLONOSCOPY  Oc 2005   normal colonoscopy   COLONOSCOPY N/A 07/25/2016   Procedure: COLONOSCOPY;  Surgeon: Corbin Ade, MD;  Location: [facility];  Service: Endoscopy;  Laterality: N/A;  9:15 AM   pH and manometry   04/28/2002   RMR: Nonspecific motility disorder, DeMeester score 5.3, adequate control of reflux   throat muscle removed       reports that she has quit smoking. She has never used smokeless tobacco. She reports that she does not drink alcohol and does not use drugs.  Allergies  Allergen Reactions   Advil [Ibuprofen] Anaphylaxis and Swelling    Throat, lip swelling    Family History  Problem Relation Age of Onset   Hypothyroidism Mother     Varicose Veins Mother    Heart disease Mother    Muscular dystrophy Father    Obesity Sister    Muscular dystrophy Brother    Obesity Brother    Obesity Brother    Colon cancer Neg Hx     Prior to Admission medications   Medication Sig Start Date End Date Taking? Authorizing Provider  alendronate (FOSAMAX) 70 MG tablet Take 70 mg by mouth once a week. On Wednesday 12/22/20  Yes [provider]  aluminum-magnesium hydroxide 200-200 MG/5ML suspension Take 5 mLs by mouth every 6 (six) hours as needed for indigestion.   Yes [provider]  atorvastatin (LIPITOR) 20 MG tablet Take 20 mg by mouth in the morning. 06/09/12  Yes [provider]  bismuth subsalicylate (PEPTO BISMOL) 262 MG/15ML suspension Take 30 mLs by mouth every 6 (six) hours as needed.   Yes [provider]  calcium carbonate (TUMS - DOSED IN MG ELEMENTAL CALCIUM) 500 MG chewable tablet Chew 2 tablets by mouth daily as needed for indigestion or heartburn.   Yes [provider]  CALCIUM PO Take 2 tablets by mouth in the morning.   Yes [provider]  cholecalciferol (VITAMIN D3) 25 MCG (1000 UNIT) tablet Take 2,000 Units by mouth in the morning.   Yes [provider]  escitalopram (LEXAPRO) 20 MG tablet Take 20 mg by mouth in the morning. 06/09/12  Yes [provider]  esomeprazole (NEXIUM) 20 MG capsule Take 20 mg  by mouth in the morning.   Yes [provider]  fluticasone (FLONASE) 50 MCG/ACT nasal spray Place 1 spray into both nostrils daily as needed for allergies or rhinitis.   Yes [provider]  levothyroxine (SYNTHROID, LEVOTHROID) 100 MCG tablet Take 100 mcg by mouth daily before breakfast.   Yes [provider]  Multiple Vitamin (MULTIVITAMIN) tablet Take 2 tablets by mouth in the morning.   Yes [provider]  polyethylene glycol (MIRALAX / GLYCOLAX) 17 g packet Take 17 g by mouth daily as needed for mild  constipation or moderate constipation.   Yes [provider]    Physical Exam: Constitutional: Moderately built and nourished. Vitals:   08/21/23 1849 08/21/23 1850 08/21/23 2030  BP: (!) 150/67  136/60  Pulse: 72  66  Resp: 16    Temp: 98.7 F (37.1 C)    TempSrc: Oral    SpO2: 100%  100%  Weight:  49 kg   Height:  5\' 1"  (1.549 m)    Eyes: Anicteric no pallor. ENMT: No discharge from the ears eyes nose or mouth. Neck: No mass felt.  No neck rigidity. Respiratory: No rhonchi or crepitations. Cardiovascular: S1-S2 heard. Abdomen: Diffuse tenderness with guarding or rigidity. Musculoskeletal: No edema. Skin: No rash. Neurologic: Alert awake oriented to time place and person.  Moving all extremities. Psychiatric: Appears normal.  Normal affect.   Labs on Admission: I have personally reviewed following labs and imaging studies  CBC: Recent Labs  Lab 08/21/23 1935  WBC 15.8*  NEUTROABS 13.4*  HGB 13.7  HCT 40.7  MCV 90.6  PLT 323   Basic Metabolic Panel: Recent Labs  Lab 08/21/23 1935  NA 134*  K 3.9  CL 99  CO2 28  GLUCOSE 108*  BUN 9  CREATININE 0.57  CALCIUM 9.3   GFR: Estimated Creatinine Clearance: 48 mL/min (by C-G formula based on SCr of 0.57 mg/dL). Liver Function Tests: Recent Labs  Lab 08/21/23 1935  AST 31  ALT 28  ALKPHOS 81  BILITOT 0.7  PROT 7.1  ALBUMIN 3.4*   Recent Labs  Lab 08/21/23 1935  LIPASE 32   No results for input(s): "AMMONIA" in the last 168 hours. Coagulation Profile: No results for input(s): "INR", "PROTIME" in the last 168 hours. Cardiac Enzymes: No results for input(s): "CKTOTAL", "CKMB", "CKMBINDEX", "TROPONINI" in the last 168 hours. BNP (last 3 results) No results for input(s): "PROBNP" in the last 8760 hours. HbA1C: No results for input(s): "HGBA1C" in the last 72 hours. CBG: No results for input(s): "GLUCAP" in the last 168 hours. Lipid Profile: No results for input(s): "CHOL", "HDL",  "LDLCALC", "TRIG", "CHOLHDL", "LDLDIRECT" in the last 72 hours. Thyroid Function Tests: No results for input(s): "TSH", "T4TOTAL", "FREET4", "T3FREE", "THYROIDAB" in the last 72 hours. Anemia Panel: No results for input(s): "VITAMINB12", "FOLATE", "FERRITIN", "TIBC", "IRON", "RETICCTPCT" in the last 72 hours. Urine analysis: No results found for: "COLORURINE", "APPEARANCEUR", "LABSPEC", "PHURINE", "GLUCOSEU", "HGBUR", "BILIRUBINUR", "KETONESUR", "PROTEINUR", "UROBILINOGEN", "NITRITE", "LEUKOCYTESUR" Sepsis Labs: @LABRCNTIP (procalcitonin:4,lacticidven:4) ) Recent Results (from the past 240 hours)  Culture, blood (routine x 2)     Status: None (Preliminary result)   Collection Time: 08/21/23  6:58 PM   Specimen: BLOOD  Result Value Ref Range Status   Specimen Description BLOOD SITE NOT SPECIFIED  Final   Special Requests   Final    BOTTLES DRAWN AEROBIC AND ANAEROBIC Blood Culture results may not be optimal due to an inadequate volume of blood received in culture bottles  Performed at [facility], [address], [place], [place] [address]    Culture PENDING  Incomplete   Report Status PENDING  Incomplete     Radiological Exams on Admission: CT ABDOMEN PELVIS W CONTRAST Result Date: 08/21/2023 CLINICAL DATA:  Lower abdominal pain. EXAM: CT ABDOMEN AND PELVIS WITH CONTRAST TECHNIQUE: Multidetector CT imaging of the abdomen and pelvis was performed using the standard protocol following bolus administration of intravenous contrast. RADIATION DOSE REDUCTION: This exam was performed according to the departmental dose-optimization program which includes automated exposure control, adjustment of the mA and/or kV according to patient size and/or use of iterative reconstruction technique. CONTRAST:  OMNIPAQUE IOHEXOL 300 MG/ML  SOLN COMPARISON:  None Available. FINDINGS: Lower chest: The visualized lung bases are clear. No intra-abdominal free air.  Small free fluid in the pelvis.  Hepatobiliary: The liver is unremarkable. No biliary dilatation. The gallbladder is unremarkable. Pancreas: Unremarkable. No pancreatic ductal dilatation or surrounding inflammatory changes. Spleen: Normal in size without focal abnormality. Adrenals/Urinary Tract: The adrenal glands are unremarkable. The kidneys, visualized ureters, and urinary bladder appear unremarkable. Stomach/Bowel: There is no bowel obstruction. There is inflammatory changes in the right hemipelvis surrounding the appendix. The appendix is only partially visualized due to extensive inflammatory changes. The visualized proximal appendix appears thickened and inflamed measuring approximately 12 mm in diameter. There is partially organized small fluid collection adjacent to the appendix measuring approximately 2.3 x 1.1 cm most consistent with developing abscess. The appendix is located in the right hemipelvis and extends posteriorly along the right adnexa. Findings most consistent with perforated acute appendicitis with developing abscesses. There is reactive inflammation and thickening of the terminal ileum. Vascular/Lymphatic: There is mild atherosclerotic calcification of the abdominal aorta. The IVC is unremarkable. No portal venous gas. There is no adenopathy. Reproductive: The uterus is grossly unremarkable. No suspicious adnexal masses. Other: None Musculoskeletal: Osteopenia with scoliosis and degenerative changes of the spine. No acute osseous pathology. IMPRESSION: 1. Findings most consistent with perforated acute appendicitis with developing abscesses. 2.  Aortic Atherosclerosis (ICD10-I70.0). Electronically Signed   By: Elgie Collard M.D.   On: 08/21/2023 17:17     Assessment/Plan Principal Problem:   Perforated appendix Active Problems:   GERD (gastroesophageal reflux disease)   Hypothyroidism   HLD (hyperlipidemia)    Perforated appendix with possible developing abscess -   appreciate general surgery consult.   Patient is n.p.o. IV fluids and on Zosyn.  Further recommendation as per general surgery. History of hypothyroidism  takes Synthroid.  Presently NPO. History of hyperlipidemia on statins presently NPO. History of GERD takes PPI.  Since patient has perforated appendix with possible developing abscess will need close monitoring and more than 2 midnight stay in inpatient status.   DVT prophylaxis: SCDs. Code Status: Full code. Family Communication: Patient's husband at the bedside. Disposition Plan: Medical floor. Consults called: General surgery. Admission status: Patient.

## 2023-08-21 NOTE — ED Triage Notes (Signed)
 Pt to ED via POV c/o abdominal pain, reports had CT scan today and was told to come to the ED for a ruptured appendix. Pt husband with pt.

## 2023-08-22 ENCOUNTER — Encounter (HOSPITAL_COMMUNITY): Payer: Self-pay | Admitting: Internal Medicine

## 2023-08-22 DIAGNOSIS — K3532 Acute appendicitis with perforation and localized peritonitis, without abscess: Secondary | ICD-10-CM | POA: Diagnosis not present

## 2023-08-22 LAB — COMPREHENSIVE METABOLIC PANEL
ALT: 24 U/L (ref 0–44)
AST: 26 U/L (ref 15–41)
Albumin: 3 g/dL — ABNORMAL LOW (ref 3.5–5.0)
Alkaline Phosphatase: 77 U/L (ref 38–126)
Anion gap: 12 (ref 5–15)
BUN: 12 mg/dL (ref 8–23)
CO2: 24 mmol/L (ref 22–32)
Calcium: 8.9 mg/dL (ref 8.9–10.3)
Chloride: 99 mmol/L (ref 98–111)
Creatinine, Ser: 0.65 mg/dL (ref 0.44–1.00)
GFR, Estimated: >60 mL/min (ref >60)
Glucose, Bld: 128 mg/dL — ABNORMAL HIGH (ref 70–99)
Potassium: 3.6 mmol/L (ref 3.5–5.1)
Sodium: 135 mmol/L (ref 135–145)
Total Bilirubin: 0.9 mg/dL (ref 0.0–1.2)
Total Protein: 6.5 g/dL (ref 6.5–8.1)

## 2023-08-22 LAB — CBC
HCT: 36.8 % (ref 36.0–46.0)
Hemoglobin: 12.3 g/dL (ref 12.0–15.0)
MCH: 30.5 pg (ref 26.0–34.0)
MCHC: 33.4 g/dL (ref 30.0–36.0)
MCV: 91.3 fL (ref 80.0–100.0)
Platelets: 302 10*3/uL (ref 150–400)
RBC: 4.03 MIL/uL (ref 3.87–5.11)
RDW: 12.9 % (ref 11.5–15.5)
WBC: 13.8 10*3/uL — ABNORMAL HIGH (ref 4.0–10.5)
nRBC: 0 % (ref 0.0–0.2)

## 2023-08-22 MED ORDER — ATORVASTATIN CALCIUM 10 MG PO TABS
20.0000 mg | ORAL_TABLET | Freq: Every morning | ORAL | Status: DC
  Administered 2023-08-23 – 2023-08-30 (×8): 20 mg via ORAL
  Filled 2023-08-22 (×8): qty 2

## 2023-08-22 MED ORDER — POLYETHYLENE GLYCOL 3350 17 G PO PACK
17.0000 g | PACK | Freq: Every day | ORAL | Status: DC
  Administered 2023-08-22 – 2023-08-26 (×4): 17 g via ORAL
  Filled 2023-08-22 (×4): qty 1

## 2023-08-22 MED ORDER — DOCUSATE SODIUM 100 MG PO CAPS
100.0000 mg | ORAL_CAPSULE | Freq: Every day | ORAL | Status: DC
  Administered 2023-08-22 – 2023-08-28 (×6): 100 mg via ORAL
  Filled 2023-08-22 (×8): qty 1

## 2023-08-22 MED ORDER — LEVOTHYROXINE SODIUM 50 MCG PO TABS
100.0000 ug | ORAL_TABLET | Freq: Every day | ORAL | Status: DC
  Administered 2023-08-23 – 2023-08-30 (×8): 100 ug via ORAL
  Filled 2023-08-22 (×8): qty 2

## 2023-08-22 MED ORDER — OXYCODONE HCL 5 MG PO TABS
2.5000 mg | ORAL_TABLET | ORAL | Status: DC | PRN
  Administered 2023-08-22 – 2023-08-30 (×7): 5 mg via ORAL
  Filled 2023-08-22 (×7): qty 1

## 2023-08-22 MED ORDER — ESCITALOPRAM OXALATE 10 MG PO TABS
20.0000 mg | ORAL_TABLET | Freq: Every morning | ORAL | Status: DC
  Administered 2023-08-23 – 2023-08-30 (×8): 20 mg via ORAL
  Filled 2023-08-22 (×8): qty 2

## 2023-08-22 NOTE — Progress Notes (Signed)
Progress Note     Subjective: Mild RLQ pain this am. No nausea or vomiting. No BM since Monday and feeling slightly bloated. She tells me she can have some difficulty processing information and remembering things due to her muscular dystrophy. Her husband who is not at bedside this am assists with her medical care  Objective: Vital signs in last 24 hours: Temp:  [97.8 F (36.6 C)-98.8 F (37.1 C)] 97.8 F (36.6 C) (01/16 0750) Pulse Rate:  [65-85] 65 (01/16 0750) Resp:  [14-16] 16 (01/16 0750) BP: (116-150)/(56-75) 116/56 (01/16 0750) SpO2:  [98 %-100 %] 100 % (01/16 0750) Weight:  [49 kg] 49 kg (01/15 1850)    Intake/Output from previous day: 01/15 0701 - 01/16 0700 In: 115.3 [I.V.:65.8; IV Piggyback:49.5] Out: -  Intake/Output this shift: Total I/O In: 48.2 [IV Piggyback:48.2] Out: -   PE: General: pleasant, WD, female who is laying in bed in NAD Heart: regular, rate, and rhythm Lungs:  Respiratory effort nonlabored on room air Abd: soft, mild TTP RLQ. Minimal distension MSK: all 4 extremities are symmetrical with no cyanosis, clubbing, or edema. Skin: warm and dry Psych: A&Ox3 with an appropriate affect.    Lab Results:  Recent Labs    08/21/23 1935 08/22/23 0224  WBC 15.8* 13.8*  HGB 13.7 12.3  HCT 40.7 36.8  PLT 323 302   BMET Recent Labs    08/21/23 1935 08/22/23 0224  NA 134* 135  K 3.9 3.6  CL 99 99  CO2 28 24  GLUCOSE 108* 128*  BUN 9 12  CREATININE 0.57 0.65  CALCIUM 9.3 8.9   PT/INR No results for input(s): "LABPROT", "INR" in the last 72 hours. CMP     Component Value Date/Time   NA 135 08/22/2023 0224   K 3.6 08/22/2023 0224   CL 99 08/22/2023 0224   CO2 24 08/22/2023 0224   GLUCOSE 128 (H) 08/22/2023 0224   BUN 12 08/22/2023 0224   CREATININE 0.65 08/22/2023 0224   CALCIUM 8.9 08/22/2023 0224   PROT 6.5 08/22/2023 0224   ALBUMIN 3.0 (L) 08/22/2023 0224   AST 26 08/22/2023 0224   ALT 24 08/22/2023 0224   ALKPHOS 77  08/22/2023 0224   BILITOT 0.9 08/22/2023 0224   GFRNONAA >60 08/22/2023 0224   GFRAA  01/30/2009 0355    >60        The eGFR has been calculated using the MDRD equation. This calculation has not been validated in all clinical situations. eGFR's persistently <60 mL/min signify possible Chronic Kidney Disease.   Lipase     Component Value Date/Time   LIPASE 32 08/21/2023 1935       Studies/Results: CT ABDOMEN PELVIS W CONTRAST Result Date: 08/21/2023 CLINICAL DATA:  Lower abdominal pain. EXAM: CT ABDOMEN AND PELVIS WITH CONTRAST TECHNIQUE: Multidetector CT imaging of the abdomen and pelvis was performed using the standard protocol following bolus administration of intravenous contrast. RADIATION DOSE REDUCTION: This exam was performed according to the departmental dose-optimization program which includes automated exposure control, adjustment of the mA and/or kV according to patient size and/or use of iterative reconstruction technique. CONTRAST:  OMNIPAQUE IOHEXOL 300 MG/ML  SOLN COMPARISON:  None Available. FINDINGS: Lower chest: The visualized lung bases are clear. No intra-abdominal free air.  Small free fluid in the pelvis. Hepatobiliary: The liver is unremarkable. No biliary dilatation. The gallbladder is unremarkable. Pancreas: Unremarkable. No pancreatic ductal dilatation or surrounding inflammatory changes. Spleen: Normal in size without focal abnormality. Adrenals/Urinary Tract:  The adrenal glands are unremarkable. The kidneys, visualized ureters, and urinary bladder appear unremarkable. Stomach/Bowel: There is no bowel obstruction. There is inflammatory changes in the right hemipelvis surrounding the appendix. The appendix is only partially visualized due to extensive inflammatory changes. The visualized proximal appendix appears thickened and inflamed measuring approximately 12 mm in diameter. There is partially organized small fluid collection adjacent to the appendix  measuring approximately 2.3 x 1.1 cm most consistent with developing abscess. The appendix is located in the right hemipelvis and extends posteriorly along the right adnexa. Findings most consistent with perforated acute appendicitis with developing abscesses. There is reactive inflammation and thickening of the terminal ileum. Vascular/Lymphatic: There is mild atherosclerotic calcification of the abdominal aorta. The IVC is unremarkable. No portal venous gas. There is no adenopathy. Reproductive: The uterus is grossly unremarkable. No suspicious adnexal masses. Other: None Musculoskeletal: Osteopenia with scoliosis and degenerative changes of the spine. No acute osseous pathology. IMPRESSION: 1. Findings most consistent with perforated acute appendicitis with developing abscesses. 2.  Aortic Atherosclerosis (ICD10-I70.0). Electronically Signed   By: Elgie Collard M.D.   On: 08/21/2023 17:17    Anti-infectives: Anti-infectives (From admission, onward)    Start     Dose/Rate Route Frequency Ordered Stop   08/22/23 0200  piperacillin-tazobactam (ZOSYN) IVPB 3.375 g        3.375 g 12.5 mL/hr over 240 Minutes Intravenous Every 8 hours 08/21/23 2221     08/21/23 1900  piperacillin-tazobactam (ZOSYN) IVPB 3.375 g        3.375 g 100 mL/hr over 30 Minutes Intravenous  Once 08/21/23 1853 08/21/23 2103        Assessment/Plan Perforated appendicitis with early abscess  - discussed with MD and abscess does not appear amenable to drainage at this time - WBC 15.8 > 13. continue IV antibiotics  - resume CLD this am - pending improvement consider repeat CT prior to dc to reeval abscess. If she fails to improve or worsens would need to consider operative intervention this admission  FEN: CLD ID: [email or web address] VTE: okay for chemical ppx from surgical standpoint  Per TRH: Oculopharyngeal muscular dystrophy GERD HLD hypothyroidism  I reviewed hospitalist notes, last 24 h vitals and pain scores, last 48 h  intake and output, last 24 h labs and trends, and last 24 h imaging results.     LOS: 1 day   Eric Form, [facility] 08/22/2023, 7:58 AM Please see Amion for pager number during day hours 7:00am-4:30pm

## 2023-08-22 NOTE — Progress Notes (Addendum)
   08/22/23 1457  TOC Brief Assessment  Insurance and Status Reviewed ([employer] HMO)  Patient has primary care physician Yes Phillips Odor, John, MD)  Home environment has been reviewed from home with Husband  Prior level of function: independent  Prior/Current Home Services No current home services (No prior HH history in [place])  Social Drivers of Health Review SDOH reviewed no interventions necessary  Readmission risk has been reviewed Yes (7%)  Transition of care needs no transition of care needs at this time   Patient here due to ruptured appendicitis. IV ABX at this time  TOC will continue to follow patient for any discharge needs.

## 2023-08-22 NOTE — Progress Notes (Signed)
PROGRESS NOTE    [name]  NWG:[id number] DOB: 10/19/1950 DOA: 08/21/2023 PCP: Donna Found, MD   Brief Narrative: [name] is a 73 y.o. female with a history of hyperlipidemia and hypothyroidism.  Patient presented secondary to abdominal pain with evidence of a perforated appendix with abscess formation. Empiric antibiotics started. General surgery consulted.   Assessment/Plan:  Perforated appendix Concern for developing abscess. General surgery consulted with recommendation for medical management with continued IV antibiotics.  -Continue Zosyn IV -General surgery recommendations: IV antibiotics  Hypothyroidism -Resume Synthroid once able to take PO  Hyperlipidemia -Resume Lipitor once able to take PO  GERD -Resume Protonix once able to take PO  Osteoporosis Patient is on Fosamax, calcium and vitamin D supplementation as an outpatient.  Depression -Resume Lexapro once able to take PO  Memory impairment Noted.   DVT prophylaxis: SCDs Code Status:   Code Status: Full Code Family Communication: Husband at bedside Disposition Plan: Discharge pending on-going general surgery recommendations   Consultants:  General surgery  Procedures:  None  Antimicrobials: Zosyn IV    Subjective: Some abdominal pain located in lower quadrants. No other concerns.  Objective: BP (!) 113/57   Pulse 67   Temp 98.5 F (36.9 C) (Oral)   Resp 17   Ht 5\' 1"  (1.549 m)   Wt 49 kg   SpO2 97%   BMI 20.41 kg/m   Examination:  General exam: Appears calm and comfortable  Respiratory system: Clear to auscultation. Respiratory effort normal. Cardiovascular system: S1 & S2 heard, RRR. No murmurs, rubs, gallops or clicks. Gastrointestinal system: Abdomen is nondistended, soft and tender in lower quadrants. No organomegaly or masses felt. Normal bowel sounds heard. Central nervous system: Alert and oriented. No focal neurological deficits. Musculoskeletal: No  edema. No calf tenderness Psychiatry: Judgement and insight appear normal. Mood & affect appropriate.    Data Reviewed: I have personally reviewed following labs and imaging studies   Last CBC Lab Results  Component Value Date   WBC 13.8 (H) 08/22/2023   HGB 12.3 08/22/2023   HCT 36.8 08/22/2023   MCV 91.3 08/22/2023   MCH 30.5 08/22/2023   RDW 12.9 08/22/2023   PLT 302 08/22/2023     Last metabolic panel Lab Results  Component Value Date   GLUCOSE 128 (H) 08/22/2023   NA 135 08/22/2023   K 3.6 08/22/2023   CL 99 08/22/2023   CO2 24 08/22/2023   BUN 12 08/22/2023   CREATININE 0.65 08/22/2023   GFRNONAA >60 08/22/2023   CALCIUM 8.9 08/22/2023   PROT 6.5 08/22/2023   ALBUMIN 3.0 (L) 08/22/2023   BILITOT 0.9 08/22/2023   ALKPHOS 77 08/22/2023   AST 26 08/22/2023   ALT 24 08/22/2023   ANIONGAP 12 08/22/2023     Creatinine Clearance: Estimated Creatinine Clearance: 48 mL/min (by C-G formula based on SCr of 0.65 mg/dL).  Recent Results (from the past 240 hours)  Culture, blood (routine x 2)     Status: None (Preliminary result)   Collection Time: 08/21/23  6:53 PM   Specimen: BLOOD  Result Value Ref Range Status   Specimen Description BLOOD SITE NOT SPECIFIED  Final   Special Requests   Final    BOTTLES DRAWN AEROBIC AND ANAEROBIC Blood Culture results may not be optimal due to an inadequate volume of blood received in culture bottles   Culture   Final    NO GROWTH < 12 HOURS Performed at [facility], [address] [place], [place] [address]    Report Status PENDING  Incomplete  Culture, blood (routine x 2)     Status: None (Preliminary result)   Collection Time: 08/21/23  6:58 PM   Specimen: BLOOD  Result Value Ref Range Status   Specimen Description BLOOD SITE NOT SPECIFIED  Final   Special Requests   Final    BOTTLES DRAWN AEROBIC AND ANAEROBIC Blood Culture results may not be optimal due to an inadequate volume of blood received in culture bottles    Culture   Final    NO GROWTH < 12 HOURS Performed at [facility], [address] [place], [place] [address]    Report Status PENDING  Incomplete      Radiology Studies: CT ABDOMEN PELVIS W CONTRAST Result Date: 08/21/2023 CLINICAL DATA:  Lower abdominal pain. EXAM: CT ABDOMEN AND PELVIS WITH CONTRAST TECHNIQUE: Multidetector CT imaging of the abdomen and pelvis was performed using the standard protocol following bolus administration of intravenous contrast. RADIATION DOSE REDUCTION: This exam was performed according to the departmental dose-optimization program which includes automated exposure control, adjustment of the mA and/or kV according to patient size and/or use of iterative reconstruction technique. CONTRAST:  OMNIPAQUE IOHEXOL 300 MG/ML  SOLN COMPARISON:  None Available. FINDINGS: Lower chest: The visualized lung bases are clear. No intra-abdominal free air.  Small free fluid in the pelvis. Hepatobiliary: The liver is unremarkable. No biliary dilatation. The gallbladder is unremarkable. Pancreas: Unremarkable. No pancreatic ductal dilatation or surrounding inflammatory changes. Spleen: Normal in size without focal abnormality. Adrenals/Urinary Tract: The adrenal glands are unremarkable. The kidneys, visualized ureters, and urinary bladder appear unremarkable. Stomach/Bowel: There is no bowel obstruction. There is inflammatory changes in the right hemipelvis surrounding the appendix. The appendix is only partially visualized due to extensive inflammatory changes. The visualized proximal appendix appears thickened and inflamed measuring approximately 12 mm in diameter. There is partially organized small fluid collection adjacent to the appendix measuring approximately 2.3 x 1.1 cm most consistent with developing abscess. The appendix is located in the right hemipelvis and extends posteriorly along the right adnexa. Findings most consistent with perforated acute appendicitis with  developing abscesses. There is reactive inflammation and thickening of the terminal ileum. Vascular/Lymphatic: There is mild atherosclerotic calcification of the abdominal aorta. The IVC is unremarkable. No portal venous gas. There is no adenopathy. Reproductive: The uterus is grossly unremarkable. No suspicious adnexal masses. Other: None Musculoskeletal: Osteopenia with scoliosis and degenerative changes of the spine. No acute osseous pathology. IMPRESSION: 1. Findings most consistent with perforated acute appendicitis with developing abscesses. 2.  Aortic Atherosclerosis (ICD10-I70.0). Electronically Signed   By: Elgie Collard M.D.   On: 08/21/2023 17:17      LOS: 1 day    Jacquelin Hawking, MD [facility] 08/22/2023, 1:02 PM   If 7PM-7AM, please contact night-coverage [email or web address]

## 2023-08-23 DIAGNOSIS — K3532 Acute appendicitis with perforation and localized peritonitis, without abscess: Secondary | ICD-10-CM | POA: Diagnosis not present

## 2023-08-23 LAB — CBC
HCT: 34.4 % — ABNORMAL LOW (ref 36.0–46.0)
Hemoglobin: 11.5 g/dL — ABNORMAL LOW (ref 12.0–15.0)
MCH: 30 pg (ref 26.0–34.0)
MCHC: 33.4 g/dL (ref 30.0–36.0)
MCV: 89.8 fL (ref 80.0–100.0)
Platelets: 326 10*3/uL (ref 150–400)
RBC: 3.83 MIL/uL — ABNORMAL LOW (ref 3.87–5.11)
RDW: 12.9 % (ref 11.5–15.5)
WBC: 10.6 10*3/uL — ABNORMAL HIGH (ref 4.0–10.5)
nRBC: 0 % (ref 0.0–0.2)

## 2023-08-23 MED ORDER — ENOXAPARIN SODIUM 30 MG/0.3ML IJ SOSY
30.0000 mg | PREFILLED_SYRINGE | INTRAMUSCULAR | Status: DC
  Administered 2023-08-23 – 2023-08-26 (×4): 30 mg via SUBCUTANEOUS
  Filled 2023-08-23 (×4): qty 0.3

## 2023-08-23 MED ORDER — GLYCERIN (LAXATIVE) 2 G RE SUPP
1.0000 | RECTAL | Status: DC | PRN
  Filled 2023-08-23: qty 1

## 2023-08-23 MED ORDER — ENOXAPARIN SODIUM 40 MG/0.4ML IJ SOSY: 40.0000 mg | PREFILLED_SYRINGE | INTRAMUSCULAR | Status: DC

## 2023-08-23 NOTE — Progress Notes (Signed)
PROGRESS NOTE    [name]  URK:[id number] DOB: 1951/04/14 DOA: 08/21/2023 PCP: Assunta Found, MD   Brief Narrative: [name] is a 73 y.o. female with a history of hyperlipidemia and hypothyroidism.  Patient presented secondary to abdominal pain with evidence of a perforated appendix with abscess formation. Empiric antibiotics started. General surgery consulted.   Assessment/Plan:  Perforated appendix Concern for developing abscess. General surgery consulted with recommendation for medical management with continued IV antibiotics.  -Continue Zosyn IV -General surgery recommendations: IV antibiotics -Follow-up CBC  Hypothyroidism -Continue Synthroid once able to take PO  Hyperlipidemia -Continue Lipitor once able to take PO  GERD -Continue Protonix once able to take PO  Osteoporosis Patient is on Fosamax, calcium and vitamin D supplementation as an outpatient.  Depression -Continue Lexapro once able to take PO  Memory impairment Noted.   DVT prophylaxis: SCDs Code Status:   Code Status: Full Code Family Communication: None at bedside Disposition Plan: Discharge pending on-going general surgery recommendations. Likely discharge in 1-2 days pending ability to transition to oral antibiotics   Consultants:  General surgery  Procedures:  None  Antimicrobials: Zosyn IV    Subjective: Some improvement in abdominal pain.  Objective: BP (!) 131/58 (BP Location: Right Arm)   Pulse 67   Temp 97.8 F (36.6 C) (Oral)   Resp 18   Ht 5\' 1"  (1.549 m)   Wt 49 kg   SpO2 98%   BMI 20.41 kg/m   Examination:  General exam: Appears calm and comfortable Respiratory system: Clear to auscultation. Respiratory effort normal. Cardiovascular system: S1 & S2 heard, RRR. No murmurs, rubs, gallops or clicks. Gastrointestinal system: Abdomen is nondistended, soft and minimally tender. Normal bowel sounds heard. Central nervous system: Alert and oriented. No  focal neurological deficits. Musculoskeletal: No edema. No calf tenderness Psychiatry: Judgement and insight appear normal. Mood & affect appropriate.    Data Reviewed: I have personally reviewed following labs and imaging studies   Last CBC Lab Results  Component Value Date   WBC 13.8 (H) 08/22/2023   HGB 12.3 08/22/2023   HCT 36.8 08/22/2023   MCV 91.3 08/22/2023   MCH 30.5 08/22/2023   RDW 12.9 08/22/2023   PLT 302 08/22/2023     Last metabolic panel Lab Results  Component Value Date   GLUCOSE 128 (H) 08/22/2023   NA 135 08/22/2023   K 3.6 08/22/2023   CL 99 08/22/2023   CO2 24 08/22/2023   BUN 12 08/22/2023   CREATININE 0.65 08/22/2023   GFRNONAA >60 08/22/2023   CALCIUM 8.9 08/22/2023   PROT 6.5 08/22/2023   ALBUMIN 3.0 (L) 08/22/2023   BILITOT 0.9 08/22/2023   ALKPHOS 77 08/22/2023   AST 26 08/22/2023   ALT 24 08/22/2023   ANIONGAP 12 08/22/2023     Creatinine Clearance: Estimated Creatinine Clearance: 48 mL/min (by C-G formula based on SCr of 0.65 mg/dL).  Recent Results (from the past 240 hours)  Culture, blood (routine x 2)     Status: None (Preliminary result)   Collection Time: 08/21/23  6:53 PM   Specimen: BLOOD  Result Value Ref Range Status   Specimen Description BLOOD SITE NOT SPECIFIED  Final   Special Requests   Final    BOTTLES DRAWN AEROBIC AND ANAEROBIC Blood Culture results may not be optimal due to an inadequate volume of blood received in culture bottles   Culture   Final    NO GROWTH 2 DAYS Performed at [facility], [address], [place], [place] [address]    Report Status PENDING  Incomplete  Culture, blood (routine x 2)     Status: None (Preliminary result)   Collection Time: 08/21/23  6:58 PM   Specimen: BLOOD  Result Value Ref Range Status   Specimen Description BLOOD SITE NOT SPECIFIED  Final   Special Requests   Final    BOTTLES DRAWN AEROBIC AND ANAEROBIC Blood Culture results may not be optimal due to an  inadequate volume of blood received in culture bottles   Culture   Final    NO GROWTH 2 DAYS Performed at [facility], [address] [place], [place] [address]    Report Status PENDING  Incomplete      Radiology Studies: CT ABDOMEN PELVIS W CONTRAST Result Date: 08/21/2023 CLINICAL DATA:  Lower abdominal pain. EXAM: CT ABDOMEN AND PELVIS WITH CONTRAST TECHNIQUE: Multidetector CT imaging of the abdomen and pelvis was performed using the standard protocol following bolus administration of intravenous contrast. RADIATION DOSE REDUCTION: This exam was performed according to the departmental dose-optimization program which includes automated exposure control, adjustment of the mA and/or kV according to patient size and/or use of iterative reconstruction technique. CONTRAST:  OMNIPAQUE IOHEXOL 300 MG/ML  SOLN COMPARISON:  None Available. FINDINGS: Lower chest: The visualized lung bases are clear. No intra-abdominal free air.  Small free fluid in the pelvis. Hepatobiliary: The liver is unremarkable. No biliary dilatation. The gallbladder is unremarkable. Pancreas: Unremarkable. No pancreatic ductal dilatation or surrounding inflammatory changes. Spleen: Normal in size without focal abnormality. Adrenals/Urinary Tract: The adrenal glands are unremarkable. The kidneys, visualized ureters, and urinary bladder appear unremarkable. Stomach/Bowel: There is no bowel obstruction. There is inflammatory changes in the right hemipelvis surrounding the appendix. The appendix is only partially visualized due to extensive inflammatory changes. The visualized proximal appendix appears thickened and inflamed measuring approximately 12 mm in diameter. There is partially organized small fluid collection adjacent to the appendix measuring approximately 2.3 x 1.1 cm most consistent with developing abscess. The appendix is located in the right hemipelvis and extends posteriorly along the right adnexa. Findings most  consistent with perforated acute appendicitis with developing abscesses. There is reactive inflammation and thickening of the terminal ileum. Vascular/Lymphatic: There is mild atherosclerotic calcification of the abdominal aorta. The IVC is unremarkable. No portal venous gas. There is no adenopathy. Reproductive: The uterus is grossly unremarkable. No suspicious adnexal masses. Other: None Musculoskeletal: Osteopenia with scoliosis and degenerative changes of the spine. No acute osseous pathology. IMPRESSION: 1. Findings most consistent with perforated acute appendicitis with developing abscesses. 2.  Aortic Atherosclerosis (ICD10-I70.0). Electronically Signed   By: Elgie Collard M.D.   On: 08/21/2023 17:17      LOS: 2 days    Jacquelin Hawking, MD [facility] 08/23/2023, 10:54 AM   If 7PM-7AM, please contact night-coverage [email or web address]

## 2023-08-23 NOTE — Evaluation (Signed)
Occupational Therapy Evaluation Patient Details Name: [name] MRN: [id number] DOB: 06-07-1951 Today's Date: 08/23/2023   History of Present Illness [name] is a 73 y.o. female has been experiencing abdominal pain for the last 2 days-found to have ruptured appendix and abcess. PHMX: hyperlipidemia, hypothyroidism, cognitive issues, and MD-Oculopharyngeal.   Clinical Impression   This 73 yo female admitted with above presents to acute OT with PLOF of being totally independent to Mod I with all basic ADLs. Currently she is setup-min A. She will continue to benefit from acute OT with follow up HHOT.       If plan is discharge home, recommend the following: A little help with walking and/or transfers;A little help with bathing/dressing/bathroom;Assistance with cooking/housework;Assist for transportation;Direct supervision/assist for financial management;Direct supervision/assist for medications management    Functional Status Assessment  Patient has had a recent decline in their functional status and demonstrates the ability to make significant improvements in function in a reasonable and predictable amount of time.  Equipment Recommendations  None recommended by OT       Precautions / Restrictions Precautions Precautions: Fall Restrictions Weight Bearing Restrictions Per Provider Order: No      Mobility Bed Mobility Overal bed mobility: Modified Independent             General bed mobility comments: increased time    Transfers Overall transfer level: Needs assistance Equipment used: 1 person hand held assist Transfers: Sit to/from Stand Sit to Stand: Contact guard assist                  Balance Overall balance assessment: Needs assistance Sitting-balance support: No upper extremity supported, Feet supported Sitting balance-Leahy Scale: Good (statically; for socks she has good balance but had to lay in bed to bring feet up to donn socks)      Standing balance support: Single extremity supported Standing balance-Leahy Scale: Poor                             ADL either performed or assessed with clinical judgement   ADL Overall ADL's : Needs assistance/impaired Eating/Feeding: Independent;Sitting   Grooming: Set up;Sitting   Upper Body Bathing: Set up;Sitting   Lower Body Bathing: Contact guard assist;Sit to/from stand   Upper Body Dressing : Set up;Sitting   Lower Body Dressing: Contact guard assist;Sit to/from stand   Toilet Transfer: Minimal assistance;Ambulation Toilet Transfer Details (indicate cue type and reason): no AD (1 person intermittent HHA) Toileting- Clothing Manipulation and Hygiene: Contact guard assist;Sit to/from stand               Vision Baseline Vision/History: 1 Wears glasses (reading) Additional Comments: Oculopharyngeal muscular dystrophy            Pertinent Vitals/Pain Pain Assessment Pain Assessment: Faces Faces Pain Scale: Hurts little more Pain Location: in back Pain Descriptors / Indicators: Aching, Sore Pain Intervention(s): Limited activity within patient's tolerance, Monitored during session     Extremity/Trunk Assessment Upper Extremity Assessment Upper Extremity Assessment: Right hand dominant;RUE deficits/detail RUE Deficits / Details: has broken this arm before so cannot fully extended elbow and has wrist deformity RUE Coordination: decreased gross motor           Communication Communication Communication: No apparent difficulties   Cognition Arousal: Alert Behavior During Therapy: WFL for tasks assessed/performed Overall Cognitive Status: Within Functional Limits for tasks assessed  Home Living Family/patient expects to be discharged to:: Private residence Living Arrangements: Spouse/significant other Available Help at Discharge: Family;Available 24 hours/day Type of  Home: House Home Access: Level entry (at front)     Home Layout: One level     Bathroom Shower/Tub: Producer, television/film/video: Handicapped height     Home Equipment: Agricultural consultant (2 wheels);Cane - single point;Hand held shower head;Grab bars - tub/shower          Prior Functioning/Environment Prior Level of Function : Independent/Modified Independent             Mobility Comments: Furniture/wall walks or uses SPC in side, SPC outside ADLs Comments: Normally stands to shower (no shower seat)        OT Problem List: Decreased range of motion;Impaired balance (sitting and/or standing);Impaired vision/perception;Pain      OT Treatment/Interventions: Self-care/ADL training;Balance training;DME and/or AE instruction;Patient/family education    OT Goals(Current goals can be found in the care plan section) Acute Rehab OT Goals Patient Stated Goal: to be pain free and go home sooner than later OT Goal Formulation: With patient/family Time For Goal Achievement: 09/06/23 Potential to Achieve Goals: Good  OT Frequency: Min 1X/week       AM-PAC OT "6 Clicks" Daily Activity     Outcome Measure Help from another person eating meals?: None Help from another person taking care of personal grooming?: A Little Help from another person toileting, which includes using toliet, bedpan, or urinal?: A Little Help from another person bathing (including washing, rinsing, drying)?: A Little Help from another person to put on and taking off regular upper body clothing?: A Little Help from another person to put on and taking off regular lower body clothing?: A Little 6 Click Score: 19   End of Session    Activity Tolerance: Patient tolerated treatment well Patient left: in bed;with call bell/phone within reach;with bed alarm set;with family/visitor present  OT Visit Diagnosis: Other abnormalities of gait and mobility (R26.89);Unsteadiness on feet (R26.81);Muscle weakness  (generalized) (M62.81);Low vision, both eyes (H54.2);Pain Pain - part of body:  (lower back)                Time: 1550-1609 OT Time Calculation (min): 19 min Charges:  OT General Charges $OT Visit: 1 Visit OT Evaluation $OT Eval Moderate Complexity: 1 Mod  Cathy L. OT Acute Rehabilitation Services Office [phone]    Evette Georges 08/23/2023, 5:04 PM

## 2023-08-23 NOTE — Progress Notes (Signed)
Progress Note     Subjective: Mild RLQ pain this am - better each day. No nausea or vomiting. She tells me she can have some difficulty processing information and remembering things due to her muscular dystrophy. Her husband who is not at bedside this am assists with her medical care  Objective: Vital signs in last 24 hours: Temp:  [97.6 F (36.4 C)-98.7 F (37.1 C)] 97.6 F (36.4 C) (01/17 0733) Pulse Rate:  [64-72] 72 (01/17 0733) Resp:  [16-19] 16 (01/17 0733) BP: (113-131)/(57-82) 130/72 (01/17 0733) SpO2:  [96 %-99 %] 99 % (01/17 0733) Last BM Date : 08/20/23  Intake/Output from previous day: 01/16 0701 - 01/17 0700 In: 48.2 [IV Piggyback:48.2] Out: -  Intake/Output this shift: No intake/output data recorded.  PE: General: pleasant, WD, female who is laying in bed in NAD Heart: rrr Lungs:  Respiratory effort nonlabored on room air Abd: soft, mild TTP RLQ. No significant distention Skin: warm and dry Psych: A&Ox3 with an appropriate affect.    Lab Results:  Recent Labs    08/21/23 1935 08/22/23 0224  WBC 15.8* 13.8*  HGB 13.7 12.3  HCT 40.7 36.8  PLT 323 302   BMET Recent Labs    08/21/23 1935 08/22/23 0224  NA 134* 135  K 3.9 3.6  CL 99 99  CO2 28 24  GLUCOSE 108* 128*  BUN 9 12  CREATININE 0.57 0.65  CALCIUM 9.3 8.9   PT/INR No results for input(s): "LABPROT", "INR" in the last 72 hours. CMP     Component Value Date/Time   NA 135 08/22/2023 0224   K 3.6 08/22/2023 0224   CL 99 08/22/2023 0224   CO2 24 08/22/2023 0224   GLUCOSE 128 (H) 08/22/2023 0224   BUN 12 08/22/2023 0224   CREATININE 0.65 08/22/2023 0224   CALCIUM 8.9 08/22/2023 0224   PROT 6.5 08/22/2023 0224   ALBUMIN 3.0 (L) 08/22/2023 0224   AST 26 08/22/2023 0224   ALT 24 08/22/2023 0224   ALKPHOS 77 08/22/2023 0224   BILITOT 0.9 08/22/2023 0224   GFRNONAA >60 08/22/2023 0224   GFRAA  01/30/2009 0355    >60        The eGFR has been calculated using the MDRD  equation. This calculation has not been validated in all clinical situations. eGFR's persistently <60 mL/min signify possible Chronic Kidney Disease.   Lipase     Component Value Date/Time   LIPASE 32 08/21/2023 1935       Studies/Results: CT ABDOMEN PELVIS W CONTRAST Result Date: 08/21/2023 CLINICAL DATA:  Lower abdominal pain. EXAM: CT ABDOMEN AND PELVIS WITH CONTRAST TECHNIQUE: Multidetector CT imaging of the abdomen and pelvis was performed using the standard protocol following bolus administration of intravenous contrast. RADIATION DOSE REDUCTION: This exam was performed according to the departmental dose-optimization program which includes automated exposure control, adjustment of the mA and/or kV according to patient size and/or use of iterative reconstruction technique. CONTRAST:  OMNIPAQUE IOHEXOL 300 MG/ML  SOLN COMPARISON:  None Available. FINDINGS: Lower chest: The visualized lung bases are clear. No intra-abdominal free air.  Small free fluid in the pelvis. Hepatobiliary: The liver is unremarkable. No biliary dilatation. The gallbladder is unremarkable. Pancreas: Unremarkable. No pancreatic ductal dilatation or surrounding inflammatory changes. Spleen: Normal in size without focal abnormality. Adrenals/Urinary Tract: The adrenal glands are unremarkable. The kidneys, visualized ureters, and urinary bladder appear unremarkable. Stomach/Bowel: There is no bowel obstruction. There is inflammatory changes in the right hemipelvis surrounding  the appendix. The appendix is only partially visualized due to extensive inflammatory changes. The visualized proximal appendix appears thickened and inflamed measuring approximately 12 mm in diameter. There is partially organized small fluid collection adjacent to the appendix measuring approximately 2.3 x 1.1 cm most consistent with developing abscess. The appendix is located in the right hemipelvis and extends posteriorly along the right  adnexa. Findings most consistent with perforated acute appendicitis with developing abscesses. There is reactive inflammation and thickening of the terminal ileum. Vascular/Lymphatic: There is mild atherosclerotic calcification of the abdominal aorta. The IVC is unremarkable. No portal venous gas. There is no adenopathy. Reproductive: The uterus is grossly unremarkable. No suspicious adnexal masses. Other: None Musculoskeletal: Osteopenia with scoliosis and degenerative changes of the spine. No acute osseous pathology. IMPRESSION: 1. Findings most consistent with perforated acute appendicitis with developing abscesses. 2.  Aortic Atherosclerosis (ICD10-I70.0). Electronically Signed   By: Elgie Collard M.D.   On: 08/21/2023 17:17    Anti-infectives: Anti-infectives (From admission, onward)    Start     Dose/Rate Route Frequency Ordered Stop   08/22/23 0200  piperacillin-tazobactam (ZOSYN) IVPB 3.375 g        3.375 g 12.5 mL/hr over 240 Minutes Intravenous Every 8 hours 08/21/23 2221     08/21/23 1900  piperacillin-tazobactam (ZOSYN) IVPB 3.375 g        3.375 g 100 mL/hr over 30 Minutes Intravenous  Once 08/21/23 1853 08/21/23 2103        Assessment/Plan Perforated appendicitis with early abscess  - discussed with MD and abscess does not appear amenable to drainage at this time - WBC 15.8 > 13. continue IV antibiotics - repeat pending today - Liquids, advancing as tolerated  FEN: CLD, advance as tolerated ID: [email or web address] VTE: okay for chemical ppx from surgical standpoint  Per TRH: Oculopharyngeal muscular dystrophy GERD HLD hypothyroidism  I reviewed hospitalist notes, last 24 h vitals and pain scores, last 48 h intake and output, last 24 h labs and trends, and last 24 h imaging results.  I spent a total of 35 minutes in both face-to-face and non-face-to-face activities, excluding procedures performed, for this visit on the date of this encounter.    LOS: 2 days   Andria Meuse, MD [facility] 08/23/2023, 8:45 AM Please see Amion for pager number during day hours 7:00am-4:30pm

## 2023-08-23 NOTE — Plan of Care (Signed)

## 2023-08-24 DIAGNOSIS — K3532 Acute appendicitis with perforation and localized peritonitis, without abscess: Secondary | ICD-10-CM | POA: Diagnosis not present

## 2023-08-24 LAB — CBC
HCT: 33.5 % — ABNORMAL LOW (ref 36.0–46.0)
Hemoglobin: 11.4 g/dL — ABNORMAL LOW (ref 12.0–15.0)
MCH: 30.3 pg (ref 26.0–34.0)
MCHC: 34 g/dL (ref 30.0–36.0)
MCV: 89.1 fL (ref 80.0–100.0)
Platelets: 337 10*3/uL (ref 150–400)
RBC: 3.76 MIL/uL — ABNORMAL LOW (ref 3.87–5.11)
RDW: 13 % (ref 11.5–15.5)
WBC: 11.1 10*3/uL — ABNORMAL HIGH (ref 4.0–10.5)
nRBC: 0 % (ref 0.0–0.2)

## 2023-08-24 NOTE — Progress Notes (Signed)
Progress Note     Subjective: Overall says she is slightly better than admission as does her husband at bedside.  She ate some for lunch yesterday, but was full for the rest of the day.  Starting moving her bowels last night and once this morning.  Feels more bloated today.  Objective: Vital signs in last 24 hours: Temp:  [97.6 F (36.4 C)-98.4 F (36.9 C)] 98 F (36.7 C) (01/18 0800) Pulse Rate:  [62-68] 62 (01/18 0800) Resp:  [16-18] 17 (01/18 0800) BP: (107-117)/(54-63) 107/61 (01/18 0800) SpO2:  [96 %-98 %] 97 % (01/18 0800) Last BM Date : 08/23/23  Intake/Output from previous day: No intake/output data recorded. Intake/Output this shift: No intake/output data recorded.  PE: General: pleasant, WD, female who is laying in bed in NAD Lungs:  Respiratory effort nonlabored on room air Abd: soft, mild TTP RLQ. No significant distention, but maybe some mild bloating Psych: A&Ox3 with an appropriate affect.    Lab Results:  Recent Labs    08/23/23 1054 08/24/23 0804  WBC 10.6* 11.1*  HGB 11.5* 11.4*  HCT 34.4* 33.5*  PLT 326 337   BMET Recent Labs    08/21/23 1935 08/22/23 0224  NA 134* 135  K 3.9 3.6  CL 99 99  CO2 28 24  GLUCOSE 108* 128*  BUN 9 12  CREATININE 0.57 0.65  CALCIUM 9.3 8.9   PT/INR No results for input(s): "LABPROT", "INR" in the last 72 hours. CMP     Component Value Date/Time   NA 135 08/22/2023 0224   K 3.6 08/22/2023 0224   CL 99 08/22/2023 0224   CO2 24 08/22/2023 0224   GLUCOSE 128 (H) 08/22/2023 0224   BUN 12 08/22/2023 0224   CREATININE 0.65 08/22/2023 0224   CALCIUM 8.9 08/22/2023 0224   PROT 6.5 08/22/2023 0224   ALBUMIN 3.0 (L) 08/22/2023 0224   AST 26 08/22/2023 0224   ALT 24 08/22/2023 0224   ALKPHOS 77 08/22/2023 0224   BILITOT 0.9 08/22/2023 0224   GFRNONAA >60 08/22/2023 0224   GFRAA  01/30/2009 0355    >60        The eGFR has been calculated using the MDRD equation. This calculation has not been validated  in all clinical situations. eGFR's persistently <60 mL/min signify possible Chronic Kidney Disease.   Lipase     Component Value Date/Time   LIPASE 32 08/21/2023 1935       Studies/Results: No results found.   Anti-infectives: Anti-infectives (From admission, onward)    Start     Dose/Rate Route Frequency Ordered Stop   08/22/23 0200  piperacillin-tazobactam (ZOSYN) IVPB 3.375 g        3.375 g 12.5 mL/hr over 240 Minutes Intravenous Every 8 hours 08/21/23 2221     08/21/23 1900  piperacillin-tazobactam (ZOSYN) IVPB 3.375 g        3.375 g 100 mL/hr over 30 Minutes Intravenous  Once 08/21/23 1853 08/21/23 2103        Assessment/Plan Perforated appendicitis with early abscess - abscess does not appear amenable to drainage at this time - WBC 11K yesterday, but up to 12K today - given minimal increase in WBC (although still AF), feeling more bloated today, and states she only feels a little better than admit, I think it is reasonable to recheck CBC in am and make sure this doesn't continue an uptrend.  If it does, then she may need a repeat CT.  If this normalizes and she  continues to slowly feel better, then hopefully she can DC home tomorrow  FEN: soft ID: zosyn VTE: Lovenox  Per TRH: Oculopharyngeal muscular dystrophy GERD HLD hypothyroidism  I reviewed hospitalist notes, last 24 h vitals and pain scores, last 48 h intake and output, last 24 h labs and trends, and last 24 h imaging results.   LOS: 3 days   Letha Cape, [facility] 08/24/2023, 9:43 AM Please see Amion for pager number during day hours 7:00am-4:30pm

## 2023-08-24 NOTE — Plan of Care (Signed)

## 2023-08-24 NOTE — Evaluation (Signed)
Physical Therapy Evaluation Patient Details Name: [name] MRN: [id number] DOB: 09/08/1950 Today's Date: 08/24/2023  History of Present Illness  The pt is a 73 yo female presenting 1/15 with abdominal pain, found to have perforated appendix with possible developing abscess, admitted for management. PMH includes: HLD, hypothyroidism, muscular dystrophy, and oculopharyngeal muscular dystrophy.   Clinical Impression  Pt in bed upon arrival of PT, agreeable to evaluation at this time. Prior to admission the pt was mobilizing with use of SPC or furniture walking in her home, reports active, exercising at the gym regularly and enjoys being outside. The pt was able to complete bed mobility without assistance, but does report dizziness with initial transition. By the time BP was taken, VSS and pt reports dizziness gone. She was then able to ambulate with CGA and use of RW, no overt knee buckling or need for assistance when using RW. However, with single UE support pt needing minA to steady with gait. Pt and family report x2 recent falls with changes in position, will benefit from continued skilled PT acutely and after d/c to reduce risk of continued falls at home. Anticipate pt will be safe to return home with family support once medically cleared.       If plan is discharge home, recommend the following: A little help with walking and/or transfers;A little help with bathing/dressing/bathroom;Assist for transportation;Help with stairs or ramp for entrance   Can travel by private vehicle        Equipment Recommendations None recommended by PT  Recommendations for Other Services       Functional Status Assessment Patient has had a recent decline in their functional status and demonstrates the ability to make significant improvements in function in a reasonable and predictable amount of time.     Precautions / Restrictions Precautions Precautions: Fall Precaution Comments: hx of 2 falls in  last month Restrictions Weight Bearing Restrictions Per Provider Order: No      Mobility  Bed Mobility Overal bed mobility: Modified Independent             General bed mobility comments: pt used bed rails but could complete without assist    Transfers Overall transfer level: Needs assistance Equipment used: Right platform walker Transfers: Sit to/from Stand Sit to Stand: Contact guard assist           General transfer comment: for safety    Ambulation/Gait Ambulation/Gait assistance: Contact guard assist, Min assist Gait Distance (Feet): 300 Feet (+ 10 ft) Assistive device: Rolling walker (2 wheels), 1 person hand held assist Gait Pattern/deviations: Step-through pattern, Decreased stride length, Knee flexed in stance - right, Knee flexed in stance - left, Shuffle Gait velocity: decreased Gait velocity interpretation: <1.31 ft/sec, indicative of household ambulator   General Gait Details: pt maintains bend in knees, stable with BUE support on RW. no overt buckling or LOB. attempted HHA for ambulation in room, minA through HHA to steady      Balance Overall balance assessment: Needs assistance Sitting-balance support: No upper extremity supported, Feet supported Sitting balance-Leahy Scale: Good Sitting balance - Comments: statically   Standing balance support: Single extremity supported Standing balance-Leahy Scale: Poor Standing balance comment: either minA through single UE support or CGA with BUE support                             Pertinent Vitals/Pain Pain Assessment Pain Assessment: Faces Faces Pain Scale: Hurts little more Pain  Location: in back Pain Descriptors / Indicators: Aching, Sore Pain Intervention(s): Limited activity within patient's tolerance, Monitored during session, Repositioned    Home Living Family/patient expects to be discharged to:: Private residence Living Arrangements: Spouse/significant other Available Help at  Discharge: Family;Available 24 hours/day Type of Home: House Home Access: Level entry (at front)       Home Layout: One level Home Equipment: Agricultural consultant (2 wheels);Cane - single point;Hand held shower head;Grab bars - tub/shower      Prior Function Prior Level of Function : Independent/Modified Independent             Mobility Comments: 2 falls in last month, typically active with use of cane for mobility. furniture walks or cane ADLs Comments: Normally stands to shower (no shower seat)     Extremity/Trunk Assessment   Upper Extremity Assessment Upper Extremity Assessment: Defer to OT evaluation    Lower Extremity Assessment Lower Extremity Assessment: Generalized weakness (grossly 4/5 to MMT)    Cervical / Trunk Assessment Cervical / Trunk Assessment: Normal  Communication   Communication Communication: No apparent difficulties Cueing Techniques: Verbal cues  Cognition Arousal: Alert Behavior During Therapy: WFL for tasks assessed/performed Overall Cognitive Status: Within Functional Limits for tasks assessed                                          General Comments General comments (skin integrity, edema, etc.): VSS on RA, pt reports dizziness with changes in position, BP stable once taken    Exercises     Assessment/Plan    PT Assessment Patient needs continued PT services  PT Problem List Decreased strength;Decreased activity tolerance;Decreased balance;Decreased coordination       PT Treatment Interventions DME instruction;Gait training;Stair training;Functional mobility training;Therapeutic activities;Therapeutic exercise;Balance training    PT Goals (Current goals can be found in the Care Plan section)  Acute Rehab PT Goals Patient Stated Goal: return home PT Goal Formulation: With patient Time For Goal Achievement: 09/07/23 Potential to Achieve Goals: Good    Frequency Min 1X/week        AM-PAC PT "6 Clicks" Mobility   Outcome Measure Help needed turning from your back to your side while in a flat bed without using bedrails?: A Little Help needed moving from lying on your back to sitting on the side of a flat bed without using bedrails?: A Little Help needed moving to and from a bed to a chair (including a wheelchair)?: A Little Help needed standing up from a chair using your arms (e.g., wheelchair or bedside chair)?: A Little Help needed to walk in hospital room?: A Little Help needed climbing 3-5 steps with a railing? : A Lot 6 Click Score: 17    End of Session Equipment Utilized During Treatment: Gait belt Activity Tolerance: Patient tolerated treatment well Patient left: in chair;with call bell/phone within reach (in bathroom) Nurse Communication: Mobility status PT Visit Diagnosis: Unsteadiness on feet (R26.81);Other abnormalities of gait and mobility (R26.89);History of falling (Z91.81)    Time: 4259-5638 PT Time Calculation (min) (ACUTE ONLY): 28 min   Charges:   PT Evaluation $PT Eval Moderate Complexity: 1 Mod PT Treatments $Gait Training: 8-22 mins PT General Charges $$ ACUTE PT VISIT: 1 Visit         Vickki Muff, PT, DPT   Acute Rehabilitation Department Office [phone] Secure Chat Communication Preferred  Ronnie Derby 08/24/2023, 2:50  PM

## 2023-08-24 NOTE — Progress Notes (Signed)
Occupational Therapy Treatment Patient Details Name: [name] MRN: [id number] DOB: 18-Nov-1950 Today's Date: 08/24/2023   History of present illness [name] is a 73 y.o. female has been experiencing abdominal pain for the last 2 days-found to have ruptured appendix and abcess. PHMX: hyperlipidemia, hypothyroidism, cognitive issues, and MD-Oculopharyngeal.   OT comments  Pt progressing toward established OT goals. Entering session for pt safety as pt pulled bathroom call light and needing assist back to bed; unit with no NT and light going off for several minutes. Pt needing light intermittent steadying assist for balance for ambulation back to bed; CGA to doff socks. Supine in bed with needs met at at end of session. Will continue to follow.       If plan is discharge home, recommend the following:  A little help with walking and/or transfers;A little help with bathing/dressing/bathroom;Assistance with cooking/housework;Assist for transportation;Direct supervision/assist for financial management;Direct supervision/assist for medications management   Equipment Recommendations  None recommended by OT    Recommendations for Other Services      Precautions / Restrictions Precautions Precautions: Fall Restrictions Weight Bearing Restrictions Per Provider Order: No       Mobility Bed Mobility Overal bed mobility: Modified Independent             General bed mobility comments: for return to supine    Transfers Overall transfer level: Needs assistance Equipment used: 1 person hand held assist Transfers: Sit to/from Stand Sit to Stand: Contact guard assist           General transfer comment: for safety     Balance Overall balance assessment: Needs assistance Sitting-balance support: No upper extremity supported, Feet supported Sitting balance-Leahy Scale: Good Sitting balance - Comments: statically   Standing balance support: Single extremity  supported Standing balance-Leahy Scale: Poor                             ADL either performed or assessed with clinical judgement   ADL Overall ADL's : Needs assistance/impaired                     Lower Body Dressing: Contact guard assist;Sit to/from stand   Toilet Transfer: Minimal assistance;Ambulation Toilet Transfer Details (indicate cue type and reason): 1 person HHA Toileting- Clothing Manipulation and Hygiene: Contact guard assist;Sit to/from stand              Extremity/Trunk Assessment              Vision       Perception     Praxis      Cognition Arousal: Alert Behavior During Therapy: WFL for tasks assessed/performed Overall Cognitive Status: Within Functional Limits for tasks assessed                                          Exercises      Shoulder Instructions       General Comments VSS during session    Pertinent Vitals/ Pain       Pain Assessment Pain Assessment: Faces Faces Pain Scale: Hurts little more Pain Location: in back Pain Descriptors / Indicators: Aching, Sore Pain Intervention(s): Limited activity within patient's tolerance  Home Living  Prior Functioning/Environment              Frequency  Min 1X/week        Progress Toward Goals  OT Goals(current goals can now be found in the care plan section)  Progress towards OT goals: Progressing toward goals  Acute Rehab OT Goals Patient Stated Goal: get more independent OT Goal Formulation: With patient/family Time For Goal Achievement: 09/06/23 Potential to Achieve Goals: Good ADL Goals Pt Will Perform Grooming: Independently;standing Pt Will Perform Upper Body Bathing: Independently;sitting;standing Pt Will Perform Lower Body Bathing: Independently;sit to/from stand Pt Will Perform Upper Body Dressing: Independently;sitting Pt Will Perform Lower Body Dressing:  Independently;sit to/from stand Pt Will Transfer to Toilet: Independently;ambulating;regular height toilet Pt Will Perform Toileting - Clothing Manipulation and hygiene: Independently;sit to/from stand  Plan      Co-evaluation                 AM-PAC OT "6 Clicks" Daily Activity     Outcome Measure   Help from another person eating meals?: None Help from another person taking care of personal grooming?: A Little Help from another person toileting, which includes using toliet, bedpan, or urinal?: A Little Help from another person bathing (including washing, rinsing, drying)?: A Little Help from another person to put on and taking off regular upper body clothing?: A Little Help from another person to put on and taking off regular lower body clothing?: A Little 6 Click Score: 19    End of Session Equipment Utilized During Treatment: Gait belt  OT Visit Diagnosis: Other abnormalities of gait and mobility (R26.89);Unsteadiness on feet (R26.81);Muscle weakness (generalized) (M62.81);Low vision, both eyes (H54.2);Pain Pain - part of body:  (lower back)   Activity Tolerance Patient tolerated treatment well   Patient Left in bed;with call bell/phone within reach;with bed alarm set   Nurse Communication          Time: 1248-1300 OT Time Calculation (min): 12 min  Charges: OT General Charges $OT Visit: 1 Visit OT Treatments $Self Care/Home Management : 8-22 mins  [name], OTR/L [facility] Acute Rehabilitation Office: [phone]   [name] 08/24/2023, 12:59 PM

## 2023-08-24 NOTE — Plan of Care (Signed)
  Problem: Education: Goal: Knowledge of General Education information will improve Description: Including pain rating scale, medication(s)/side effects and non-pharmacologic comfort measures Outcome: Progressing   Problem: Clinical Measurements: Goal: Diagnostic test results will improve Outcome: Progressing   Problem: Clinical Measurements: Goal: Will remain free from infection Outcome: Progressing   Problem: Clinical Measurements: Goal: Cardiovascular complication will be avoided Outcome: Progressing

## 2023-08-24 NOTE — Progress Notes (Addendum)
PROGRESS NOTE    [name]  GMW:[id number] DOB: 05-Mar-1951 DOA: 08/21/2023 PCP: Assunta Found, MD   Brief Narrative: [name] is a 73 y.o. female with a history of hyperlipidemia and hypothyroidism.  Patient presented secondary to abdominal pain with evidence of a perforated appendix with abscess formation. Empiric antibiotics started. General surgery consulted.   Assessment/Plan:  Perforated appendix Concern for developing abscess. General surgery consulted with recommendation for medical management with continued IV antibiotics. WBC up from yesterday -Continue Zosyn IV -General surgery recommendations: IV antibiotics, repeat CBC in AM  Hypothyroidism -Continue Synthroid  Hyperlipidemia -Continue Lipitor  GERD -Continue Protonix  Osteoporosis Patient is on Fosamax, calcium and vitamin D supplementation as an outpatient.  Depression -Continue Lexapro  Memory impairment Noted.   DVT prophylaxis: SCDs Code Status:   Code Status: Full Code Family Communication: None at bedside Disposition Plan: Discharge pending on-going general surgery recommendations. Likely discharge in 1 day pending ability to transition to oral antibiotics   Consultants:  General surgery  Procedures:  None  Antimicrobials: Zosyn IV    Subjective: Overall, pain is improved. Weakness has improved. Afebrile overnight. No specific concerns at this time. Multiple bowel movements.  Objective: BP 107/61 (BP Location: Left Arm)   Pulse 62   Temp 98 F (36.7 C) (Oral)   Resp 17   Ht 5\' 1"  (1.549 m)   Wt 49 kg   SpO2 97%   BMI 20.41 kg/m   Examination:  General exam: Appears calm and comfortable Respiratory system: Clear to auscultation. Respiratory effort normal. Cardiovascular system: S1 & S2 heard, RRR. Gastrointestinal system: Abdomen is nondistended, soft and mildly tender. Normal bowel sounds heard. Central nervous system: Alert and oriented. No focal  neurological deficits. Musculoskeletal: No edema. No calf tenderness Psychiatry: Judgement and insight appear normal. Mood & affect appropriate.    Data Reviewed: I have personally reviewed following labs and imaging studies   Last CBC Lab Results  Component Value Date   WBC 11.1 (H) 08/24/2023   HGB 11.4 (L) 08/24/2023   HCT 33.5 (L) 08/24/2023   MCV 89.1 08/24/2023   MCH 30.3 08/24/2023   RDW 13.0 08/24/2023   PLT 337 08/24/2023     Last metabolic panel Lab Results  Component Value Date   GLUCOSE 128 (H) 08/22/2023   NA 135 08/22/2023   K 3.6 08/22/2023   CL 99 08/22/2023   CO2 24 08/22/2023   BUN 12 08/22/2023   CREATININE 0.65 08/22/2023   GFRNONAA >60 08/22/2023   CALCIUM 8.9 08/22/2023   PROT 6.5 08/22/2023   ALBUMIN 3.0 (L) 08/22/2023   BILITOT 0.9 08/22/2023   ALKPHOS 77 08/22/2023   AST 26 08/22/2023   ALT 24 08/22/2023   ANIONGAP 12 08/22/2023     Creatinine Clearance: Estimated Creatinine Clearance: 48 mL/min (by C-G formula based on SCr of 0.65 mg/dL).  Recent Results (from the past 240 hours)  Culture, blood (routine x 2)     Status: None (Preliminary result)   Collection Time: 08/21/23  6:53 PM   Specimen: BLOOD  Result Value Ref Range Status   Specimen Description BLOOD SITE NOT SPECIFIED  Final   Special Requests   Final    BOTTLES DRAWN AEROBIC AND ANAEROBIC Blood Culture results may not be optimal due to an inadequate volume of blood received in culture bottles   Culture   Final    NO GROWTH 3 DAYS Performed at [facility], [address] [place],  [place] [address]    Report Status PENDING  Incomplete  Culture, blood (routine x 2)     Status: None (Preliminary result)   Collection Time: 08/21/23  6:58 PM   Specimen: BLOOD  Result Value Ref Range Status   Specimen Description BLOOD SITE NOT SPECIFIED  Final   Special Requests   Final    BOTTLES DRAWN AEROBIC AND ANAEROBIC Blood Culture results may not be optimal due to an  inadequate volume of blood received in culture bottles   Culture   Final    NO GROWTH 3 DAYS Performed at [facility], [address], [place], [place] [address]    Report Status PENDING  Incomplete      Radiology Studies: No results found.     LOS: 3 days    Jacquelin Hawking, MD [facility] 08/24/2023, 12:23 PM   If 7PM-7AM, please contact night-coverage [email or web address]

## 2023-08-25 DIAGNOSIS — K3532 Acute appendicitis with perforation and localized peritonitis, without abscess: Secondary | ICD-10-CM | POA: Diagnosis not present

## 2023-08-25 LAB — CBC
HCT: 33.8 % — ABNORMAL LOW (ref 36.0–46.0)
Hemoglobin: 11.4 g/dL — ABNORMAL LOW (ref 12.0–15.0)
MCH: 30.2 pg (ref 26.0–34.0)
MCHC: 33.7 g/dL (ref 30.0–36.0)
MCV: 89.7 fL (ref 80.0–100.0)
Platelets: 363 10*3/uL (ref 150–400)
RBC: 3.77 MIL/uL — ABNORMAL LOW (ref 3.87–5.11)
RDW: 13.2 % (ref 11.5–15.5)
WBC: 12.8 10*3/uL — ABNORMAL HIGH (ref 4.0–10.5)
nRBC: 0 % (ref 0.0–0.2)

## 2023-08-25 MED ORDER — ENSURE ENLIVE PO LIQD
237.0000 mL | Freq: Two times a day (BID) | ORAL | Status: DC
  Administered 2023-08-25 – 2023-08-26 (×4): 237 mL via ORAL

## 2023-08-25 NOTE — Progress Notes (Signed)
PROGRESS NOTE    [name]  UJW:[id number] DOB: 1951-05-18 DOA: 08/21/2023 PCP: Assunta Found, MD   Brief Narrative: [name] is a 73 y.o. female with a history of hyperlipidemia and hypothyroidism.  Patient presented secondary to abdominal pain with evidence of a perforated appendix with abscess formation. Empiric antibiotics started. General surgery consulted.   Assessment/Plan:  Perforated appendix Concern for developing abscess. General surgery consulted with recommendation for medical management with continued IV antibiotics. WBC continues to rise. -Continue Zosyn IV -General surgery recommendations: IV antibiotics, pending today  Hypothyroidism -Continue Synthroid  Hyperlipidemia -Continue Lipitor  GERD -Continue Protonix  Osteoporosis Patient is on Fosamax, calcium and vitamin D supplementation as an outpatient.  Depression -Continue Lexapro  Memory impairment Noted.   DVT prophylaxis: SCDs Code Status:   Code Status: Full Code Family Communication: None at bedside Disposition Plan: Discharge pending on-going general surgery recommendations. Likely discharge in 1 day pending ability to transition to oral antibiotics   Consultants:  General surgery  Procedures:  None  Antimicrobials: Zosyn IV    Subjective: Patient reports intermittent pain, especially after bowel movement located in her back area. But she also reports RLQ abdominal pain that is sharp and intermittent.  Objective: BP 134/60 (BP Location: Right Arm)   Pulse 65   Temp 98.6 F (37 C) (Oral)   Resp 17   Ht 5\' 1"  (1.549 m)   Wt 49 kg   SpO2 97%   BMI 20.41 kg/m   Examination:  General exam: Appears calm and comfortable Respiratory system: Clear to auscultation. Respiratory effort normal. Cardiovascular system: S1 & S2 heard, RRR. No murmurs, rubs, gallops or clicks. Gastrointestinal system: Abdomen is nondistended, soft and nontender. Normal bowel sounds  heard. Central nervous system: Alert and oriented. No focal neurological deficits. Musculoskeletal: No edema. No calf tenderness Skin: No cyanosis. No rashes Psychiatry: Judgement and insight appear normal. Mood & affect appropriate.    Data Reviewed: I have personally reviewed following labs and imaging studies   Last CBC Lab Results  Component Value Date   WBC 12.8 (H) 08/25/2023   HGB 11.4 (L) 08/25/2023   HCT 33.8 (L) 08/25/2023   MCV 89.7 08/25/2023   MCH 30.2 08/25/2023   RDW 13.2 08/25/2023   PLT 363 08/25/2023     Last metabolic panel Lab Results  Component Value Date   GLUCOSE 128 (H) 08/22/2023   NA 135 08/22/2023   K 3.6 08/22/2023   CL 99 08/22/2023   CO2 24 08/22/2023   BUN 12 08/22/2023   CREATININE 0.65 08/22/2023   GFRNONAA >60 08/22/2023   CALCIUM 8.9 08/22/2023   PROT 6.5 08/22/2023   ALBUMIN 3.0 (L) 08/22/2023   BILITOT 0.9 08/22/2023   ALKPHOS 77 08/22/2023   AST 26 08/22/2023   ALT 24 08/22/2023   ANIONGAP 12 08/22/2023     Creatinine Clearance: Estimated Creatinine Clearance: 48 mL/min (by C-G formula based on SCr of 0.65 mg/dL).  Recent Results (from the past 240 hours)  Culture, blood (routine x 2)     Status: None (Preliminary result)   Collection Time: 08/21/23  6:53 PM   Specimen: BLOOD  Result Value Ref Range Status   Specimen Description BLOOD SITE NOT SPECIFIED  Final   Special Requests   Final    BOTTLES DRAWN AEROBIC AND ANAEROBIC Blood Culture results may not be optimal due to an inadequate volume of blood received in culture bottles   Culture   Final  NO GROWTH 3 DAYS Performed at [facility], [address], [place], [place] [address]    Report Status PENDING  Incomplete  Culture, blood (routine x 2)     Status: None (Preliminary result)   Collection Time: 08/21/23  6:58 PM   Specimen: BLOOD  Result Value Ref Range Status   Specimen Description BLOOD SITE NOT SPECIFIED  Final   Special Requests   Final     BOTTLES DRAWN AEROBIC AND ANAEROBIC Blood Culture results may not be optimal due to an inadequate volume of blood received in culture bottles   Culture   Final    NO GROWTH 3 DAYS Performed at [facility], [address] [place], [place] [address]    Report Status PENDING  Incomplete      Radiology Studies: No results found.     LOS: 4 days    Jacquelin Hawking, MD [facility] 08/25/2023, 8:58 AM   If 7PM-7AM, please contact night-coverage [email or web address]

## 2023-08-25 NOTE — Progress Notes (Signed)
Mobility Specialist: Progress Note   08/25/23 1157  Mobility  Activity Ambulated with assistance in hallway  Level of Assistance Contact guard assist, steadying assist  Assistive Device Front wheel walker  Distance Ambulated (ft) 700 ft  Activity Response Tolerated well  Mobility Referral Yes  Mobility visit 1 Mobility  Mobility Specialist Start Time (ACUTE ONLY) 1116  Mobility Specialist Stop Time (ACUTE ONLY) 1132  Mobility Specialist Time Calculation (min) (ACUTE ONLY) 16 min   Received pt in bed having no complaints and agreeable to mobility. Pt was asymptomatic throughout ambulation and returned to room w/o fault. Left in bed w/ call bell in reach and all needs met.  Maurene Capes Mobility Specialist Please contact via SecureChat or Rehab office at [phone]

## 2023-08-25 NOTE — Progress Notes (Signed)
Progress Note     Subjective: Having a little more RLQ pain overnight and this morning.  Solid meat type foods didn't settle well for her yesterday.  Still moving her bowels well   Objective: Vital signs in last 24 hours: Temp:  [97.7 F (36.5 C)-100.2 F (37.9 C)] 98.2 F (36.8 C) (01/19 0900) Pulse Rate:  [60-65] 65 (01/19 0900) Resp:  [17-18] 18 (01/19 0900) BP: (116-136)/(60-71) 116/63 (01/19 0900) SpO2:  [97 %-98 %] 98 % (01/19 0900) Last BM Date : 08/23/22  Intake/Output from previous day: 01/18 0701 - 01/19 0700 In: 240 [P.O.:240] Out: -  Intake/Output this shift: No intake/output data recorded.  PE: General: pleasant, WD, female who is laying in bed in NAD Lungs:  Respiratory effort nonlabored on room air Abd: softer today, mild TTP RLQ. No significant distention Psych: A&Ox3 with an appropriate affect.    Lab Results:  Recent Labs    08/24/23 0804 08/25/23 0420  WBC 11.1* 12.8*  HGB 11.4* 11.4*  HCT 33.5* 33.8*  PLT 337 363   BMET No results for input(s): "NA", "K", "CL", "CO2", "GLUCOSE", "BUN", "CREATININE", "CALCIUM" in the last 72 hours.  PT/INR No results for input(s): "LABPROT", "INR" in the last 72 hours. CMP     Component Value Date/Time   NA 135 08/22/2023 0224   K 3.6 08/22/2023 0224   CL 99 08/22/2023 0224   CO2 24 08/22/2023 0224   GLUCOSE 128 (H) 08/22/2023 0224   BUN 12 08/22/2023 0224   CREATININE 0.65 08/22/2023 0224   CALCIUM 8.9 08/22/2023 0224   PROT 6.5 08/22/2023 0224   ALBUMIN 3.0 (L) 08/22/2023 0224   AST 26 08/22/2023 0224   ALT 24 08/22/2023 0224   ALKPHOS 77 08/22/2023 0224   BILITOT 0.9 08/22/2023 0224   GFRNONAA >60 08/22/2023 0224   GFRAA  01/30/2009 0355    >60        The eGFR has been calculated using the MDRD equation. This calculation has not been validated in all clinical situations. eGFR's persistently <60 mL/min signify possible Chronic Kidney Disease.   Lipase     Component Value Date/Time    LIPASE 32 08/21/2023 1935       Studies/Results: No results found.   Anti-infectives: Anti-infectives (From admission, onward)    Start     Dose/Rate Route Frequency Ordered Stop   08/22/23 0200  piperacillin-tazobactam (ZOSYN) IVPB 3.375 g        3.375 g 12.5 mL/hr over 240 Minutes Intravenous Every 8 hours 08/21/23 2221     08/21/23 1900  piperacillin-tazobactam (ZOSYN) IVPB 3.375 g        3.375 g 100 mL/hr over 30 Minutes Intravenous  Once 08/21/23 1853 08/21/23 2103        Assessment/Plan Perforated appendicitis with early abscess - abscess does not appear amenable to drainage at this time - WBC 11K yesterday, but up to 12K today - given minimal increase in WBC (although still AF), feeling more pain today.  Will recheck labs in the am and order a repeat CT scan to reassess this process and fluid collection. -all questions answered for patient and family at bedside.  They are agreeable with this plan.  FEN: soft ID: [email or web address] VTE: Lovenox  Per TRH: Oculopharyngeal muscular dystrophy GERD HLD hypothyroidism  I reviewed hospitalist notes, last 24 h vitals and pain scores, last 48 h intake and output, last 24 h labs and trends, and last 24 h imaging results.   LOS:  4 days   Letha Cape, [facility] 08/25/2023, 9:09 AM Please see Amion for pager number during day hours 7:00am-4:30pm

## 2023-08-26 ENCOUNTER — Inpatient Hospital Stay (HOSPITAL_COMMUNITY): Payer: Medicare HMO

## 2023-08-26 DIAGNOSIS — K3532 Acute appendicitis with perforation and localized peritonitis, without abscess: Secondary | ICD-10-CM | POA: Diagnosis not present

## 2023-08-26 LAB — CULTURE, BLOOD (ROUTINE X 2)
Culture: NO GROWTH
Culture: NO GROWTH

## 2023-08-26 LAB — CBC
HCT: 33.9 % — ABNORMAL LOW (ref 36.0–46.0)
Hemoglobin: 11.3 g/dL — ABNORMAL LOW (ref 12.0–15.0)
MCH: 30.1 pg (ref 26.0–34.0)
MCHC: 33.3 g/dL (ref 30.0–36.0)
MCV: 90.4 fL (ref 80.0–100.0)
Platelets: 413 10*3/uL — ABNORMAL HIGH (ref 150–400)
RBC: 3.75 MIL/uL — ABNORMAL LOW (ref 3.87–5.11)
RDW: 13.2 % (ref 11.5–15.5)
WBC: 11.8 10*3/uL — ABNORMAL HIGH (ref 4.0–10.5)
nRBC: 0 % (ref 0.0–0.2)

## 2023-08-26 LAB — ABO/RH: ABO/RH(D): B POS

## 2023-08-26 LAB — BASIC METABOLIC PANEL
Anion gap: 13 (ref 5–15)
BUN: 12 mg/dL (ref 8–23)
CO2: 24 mmol/L (ref 22–32)
Calcium: 8.8 mg/dL — ABNORMAL LOW (ref 8.9–10.3)
Chloride: 99 mmol/L (ref 98–111)
Creatinine, Ser: 0.68 mg/dL (ref 0.44–1.00)
GFR, Estimated: >60 mL/min (ref >60)
Glucose, Bld: 213 mg/dL — ABNORMAL HIGH (ref 70–99)
Potassium: 3.6 mmol/L (ref 3.5–5.1)
Sodium: 136 mmol/L (ref 135–145)

## 2023-08-26 LAB — SEDIMENTATION RATE: Sed Rate: 107 mm/h — ABNORMAL HIGH (ref 0–22)

## 2023-08-26 LAB — C-REACTIVE PROTEIN: CRP: 17.4 mg/dL — ABNORMAL HIGH (ref <1.0)

## 2023-08-26 MED ORDER — PANTOPRAZOLE SODIUM 40 MG PO TBEC
40.0000 mg | DELAYED_RELEASE_TABLET | Freq: Every day | ORAL | Status: DC
  Administered 2023-08-26 – 2023-08-30 (×4): 40 mg via ORAL
  Filled 2023-08-26 (×5): qty 1

## 2023-08-26 MED ORDER — IOHEXOL 350 MG/ML SOLN
75.0000 mL | Freq: Once | INTRAVENOUS | Status: AC | PRN
  Administered 2023-08-26: 75 mL via INTRAVENOUS

## 2023-08-26 MED ORDER — SODIUM CHLORIDE 0.9 % IV SOLN
INTRAVENOUS | Status: DC
Start: 2023-08-26 — End: 2023-08-30

## 2023-08-26 NOTE — Progress Notes (Signed)
PROGRESS NOTE    [name]  ZOX:[id number] DOB: 1950-12-25 DOA: 08/21/2023 PCP: Assunta Found, MD   Brief Narrative: [name] is a 73 y.o. female with a history of hyperlipidemia and hypothyroidism.  Patient presented secondary to abdominal pain with evidence of a perforated appendix with abscess formation. Empiric antibiotics started. General surgery consulted.   Assessment/Plan:  Perforated appendix Concern for developing abscess. General surgery consulted with recommendation for medical management with continued IV antibiotics. WBC with rise and has now stabilized. Repeat CT abdomen/pelvis (1/20) significant for enlarging abscess measuring 3.8 cm. IR consulted for consideration of drain placement, however no access for drain placement identified. -Check CRP, ESR -General surgery recommendations: IV antibiotics (considering broadening to Ertapenem, which will need to be directed by their service) -May consider ID consult if no surgical plans  Hypothyroidism -Continue Synthroid  Hyperlipidemia Aortic atherosclerosis -Continue Lipitor  GERD -Continue Protonix  Osteoporosis Patient is on Fosamax, calcium and vitamin D supplementation as an outpatient.  Depression -Continue Lexapro  Memory impairment Noted.   DVT prophylaxis: SCDs Code Status:   Code Status: Full Code Family Communication: Husband at bedside Disposition Plan: Discharge pending on-going general surgery recommendations/management   Consultants:  General surgery Interventional radiology  Procedures:  None  Antimicrobials: Zosyn IV    Subjective: Ongoing abdominal pain. Spirits are low because of the bad news of her worsened infection.  Objective: BP (!) 111/55 (BP Location: Left Arm)   Pulse (!) 55   Temp 98.1 F (36.7 C) (Oral)   Resp 16   Ht 5\' 1"  (1.549 m)   Wt 49 kg   SpO2 97%   BMI 20.41 kg/m   Examination:  General exam: Appears calm and  comfortable Respiratory system: Clear to auscultation. Respiratory effort normal. Cardiovascular system: S1 & S2 heard, RRR. No murmurs, rubs, gallops or clicks. Gastrointestinal system: Abdomen is slightly distended, soft and tender in RLQ. Normal bowel sounds heard. Central nervous system: Alert and oriented. No focal neurological deficits. Psychiatry: Judgement and insight appear normal. Mood & affect appropriate.    Data Reviewed: I have personally reviewed following labs and imaging studies   Last CBC Lab Results  Component Value Date   WBC 11.8 (H) 08/26/2023   HGB 11.3 (L) 08/26/2023   HCT 33.9 (L) 08/26/2023   MCV 90.4 08/26/2023   MCH 30.1 08/26/2023   RDW 13.2 08/26/2023   PLT 413 (H) 08/26/2023     Last metabolic panel Lab Results  Component Value Date   GLUCOSE 213 (H) 08/26/2023   NA 136 08/26/2023   K 3.6 08/26/2023   CL 99 08/26/2023   CO2 24 08/26/2023   BUN 12 08/26/2023   CREATININE 0.68 08/26/2023   GFRNONAA >60 08/26/2023   CALCIUM 8.8 (L) 08/26/2023   PROT 6.5 08/22/2023   ALBUMIN 3.0 (L) 08/22/2023   BILITOT 0.9 08/22/2023   ALKPHOS 77 08/22/2023   AST 26 08/22/2023   ALT 24 08/22/2023   ANIONGAP 13 08/26/2023     Creatinine Clearance: Estimated Creatinine Clearance: 48 mL/min (by C-G formula based on SCr of 0.68 mg/dL).  Recent Results (from the past 240 hours)  Culture, blood (routine x 2)     Status: None (Preliminary result)   Collection Time: 08/21/23  6:53 PM   Specimen: BLOOD  Result Value Ref Range Status   Specimen Description BLOOD SITE NOT SPECIFIED  Final   Special Requests   Final    BOTTLES DRAWN AEROBIC AND ANAEROBIC Blood Culture  results may not be optimal due to an inadequate volume of blood received in culture bottles   Culture   Final    NO GROWTH 4 DAYS Performed at [facility], [address], [place], [place] [address]    Report Status PENDING  Incomplete  Culture, blood (routine x 2)     Status: None  (Preliminary result)   Collection Time: 08/21/23  6:58 PM   Specimen: BLOOD  Result Value Ref Range Status   Specimen Description BLOOD SITE NOT SPECIFIED  Final   Special Requests   Final    BOTTLES DRAWN AEROBIC AND ANAEROBIC Blood Culture results may not be optimal due to an inadequate volume of blood received in culture bottles   Culture   Final    NO GROWTH 4 DAYS Performed at [facility], [address] [place], [place] [address]    Report Status PENDING  Incomplete      Radiology Studies: CT ABDOMEN PELVIS W CONTRAST Result Date: 08/26/2023 CLINICAL DATA:  Perforated appendix, rule out abscess EXAM: CT ABDOMEN AND PELVIS WITH CONTRAST TECHNIQUE: Multidetector CT imaging of the abdomen and pelvis was performed using the standard protocol following bolus administration of intravenous contrast. RADIATION DOSE REDUCTION: This exam was performed according to the departmental dose-optimization program which includes automated exposure control, adjustment of the mA and/or kV according to patient size and/or use of iterative reconstruction technique. CONTRAST:  75mL OMNIPAQUE IOHEXOL 350 MG/ML SOLN COMPARISON:  08/21/2023 FINDINGS: Lower chest: No pleural or pericardial effusion. Subsegmental linear atelectasis posteriorly at the left lung base. Hepatobiliary: No focal liver abnormality is seen. No gallstones, gallbladder wall thickening, or biliary dilatation. Pancreas: Unremarkable. No pancreatic ductal dilatation or surrounding inflammatory changes. Spleen: Normal in size without focal abnormality. Adrenals/Urinary Tract: No adrenal mass. Symmetric renal enhancement without focal lesion or hydronephrosis. Urinary bladder incompletely distended. Stomach/Bowel: Stomach is within normal limits. Appendix appears normal. No evidence of bowel wall thickening, distention, or inflammatory changes. Thick-walled enhancing appendix at the lateral margin of this process. The remainder of the colon is  partially distended, unremarkable. Vascular/Lymphatic: A few enhancing right lower quadrant mesenteric lymph nodes presumably reactive. Minimal calcified aortic plaque without aneurysm. Portal vein patent. Reproductive: Uterus unremarkable. Other: Small volume right pelvic ascites.  No free air. Musculoskeletal: Thoracolumbar levoscoliosis apex L2 with multilevel spondylitic change. Mild bilateral hip DJD. IMPRESSION: 1. Perforated acute appendicitis with enlarging dominant abscess 3.8 cm surrounded by phlegmonous process abutting terminal ileum. 2. Small volume right pelvic fluid. 3.  Aortic Atherosclerosis (ICD10-I70.0). Electronically Signed   By: Corlis Leak M.D.   On: 08/26/2023 09:34       LOS: 5 days    Jacquelin Hawking, MD [facility] 08/26/2023, 10:07 AM   If 7PM-7AM, please contact night-coverage [email or web address]

## 2023-08-26 NOTE — Care Management Important Message (Signed)
Important Message  Patient Details  Name: [name] MRN: [id number] Date of Birth: Dec 20, 1950   Important Message Given:  Yes - Medicare IM     Dorena Bodo 08/26/2023, 3:53 PM

## 2023-08-26 NOTE — TOC Progression Note (Signed)
Transition of Care [facility]) - Progression Note    Patient Details  Name: [name] MRN: [id number] Date of Birth: [birth year]  [facility] CM/SW Contact  Harriet Masson, RN Phone Number: 08/26/2023, 1:27 PM  Clinical Narrative:    This abscess on her new CT scan today reviewed by MD and is not amendable to drainage from IR.  Patient may need surgery.  TOC following.   Expected Discharge Plan: Home/Self Care Barriers to Discharge: Continued Medical Work up  Expected Discharge Plan and Services                                               Social Determinants of Health (SDOH) Interventions SDOH Screenings   Food Insecurity: No Food Insecurity (08/22/2023)  Housing: Low Risk  (08/22/2023)  Transportation Needs: No Transportation Needs (08/22/2023)  Utilities: Not At Risk (08/22/2023)  Depression (PHQ2-9): Low Risk  (04/05/2021)  Social Connections: Socially Isolated (08/22/2023)  Tobacco Use: Medium Risk (08/22/2023)    Readmission Risk Interventions     No data to display

## 2023-08-26 NOTE — Progress Notes (Signed)
   IR Note  Abscess drain placement requested per [facility]  Imaging reviewed with Dr Fredia Sorrow No accessible access for drain placement No drain in IR at this time  CCS made aware

## 2023-08-26 NOTE — Progress Notes (Signed)
Progress Note     Subjective: Overall feels stable, but with abdominal pain, poor appetite, still with flatus   Objective: Vital signs in last 24 hours: Temp:  [97.5 F (36.4 C)-98.2 F (36.8 C)] 98.1 F (36.7 C) (01/20 0753) Pulse Rate:  [55-60] 55 (01/20 0753) Resp:  [16-18] 16 (01/20 0753) BP: (104-117)/(53-69) 111/55 (01/20 0753) SpO2:  [96 %-98 %] 97 % (01/20 0753) Last BM Date : 08/24/23  Intake/Output from previous day: No intake/output data recorded. Intake/Output this shift: No intake/output data recorded.  PE: General: pleasant, WD, female who is laying in bed in NAD Lungs:  Respiratory effort nonlabored on room air Abd: soft, mild TTP in RLQ and some on left-side today as well, +BS, ND Psych: A&Ox3 with an appropriate affect.    Lab Results:  Recent Labs    08/25/23 0420 08/26/23 0351  WBC 12.8* 11.8*  HGB 11.4* 11.3*  HCT 33.8* 33.9*  PLT 363 413*   BMET Recent Labs    08/26/23 0351  NA 136  K 3.6  CL 99  CO2 24  GLUCOSE 213*  BUN 12  CREATININE 0.68  CALCIUM 8.8*    PT/INR No results for input(s): "LABPROT", "INR" in the last 72 hours. CMP     Component Value Date/Time   NA 136 08/26/2023 0351   K 3.6 08/26/2023 0351   CL 99 08/26/2023 0351   CO2 24 08/26/2023 0351   GLUCOSE 213 (H) 08/26/2023 0351   BUN 12 08/26/2023 0351   CREATININE 0.68 08/26/2023 0351   CALCIUM 8.8 (L) 08/26/2023 0351   PROT 6.5 08/22/2023 0224   ALBUMIN 3.0 (L) 08/22/2023 0224   AST 26 08/22/2023 0224   ALT 24 08/22/2023 0224   ALKPHOS 77 08/22/2023 0224   BILITOT 0.9 08/22/2023 0224   GFRNONAA >60 08/26/2023 0351   GFRAA  01/30/2009 0355    >60        The eGFR has been calculated using the MDRD equation. This calculation has not been validated in all clinical situations. eGFR's persistently <60 mL/min signify possible Chronic Kidney Disease.   Lipase     Component Value Date/Time   LIPASE 32 08/21/2023 1935       Studies/Results: CT  ABDOMEN PELVIS W CONTRAST Result Date: 08/26/2023 CLINICAL DATA:  Perforated appendix, rule out abscess EXAM: CT ABDOMEN AND PELVIS WITH CONTRAST TECHNIQUE: Multidetector CT imaging of the abdomen and pelvis was performed using the standard protocol following bolus administration of intravenous contrast. RADIATION DOSE REDUCTION: This exam was performed according to the departmental dose-optimization program which includes automated exposure control, adjustment of the mA and/or kV according to patient size and/or use of iterative reconstruction technique. CONTRAST:  75mL OMNIPAQUE IOHEXOL 350 MG/ML SOLN COMPARISON:  08/21/2023 FINDINGS: Lower chest: No pleural or pericardial effusion. Subsegmental linear atelectasis posteriorly at the left lung base. Hepatobiliary: No focal liver abnormality is seen. No gallstones, gallbladder wall thickening, or biliary dilatation. Pancreas: Unremarkable. No pancreatic ductal dilatation or surrounding inflammatory changes. Spleen: Normal in size without focal abnormality. Adrenals/Urinary Tract: No adrenal mass. Symmetric renal enhancement without focal lesion or hydronephrosis. Urinary bladder incompletely distended. Stomach/Bowel: Stomach is within normal limits. Appendix appears normal. No evidence of bowel wall thickening, distention, or inflammatory changes. Thick-walled enhancing appendix at the lateral margin of this process. The remainder of the colon is partially distended, unremarkable. Vascular/Lymphatic: A few enhancing right lower quadrant mesenteric lymph nodes presumably reactive. Minimal calcified aortic plaque without aneurysm. Portal vein patent. Reproductive: Uterus  unremarkable. Other: Small volume right pelvic ascites.  No free air. Musculoskeletal: Thoracolumbar levoscoliosis apex L2 with multilevel spondylitic change. Mild bilateral hip DJD. IMPRESSION: 1. Perforated acute appendicitis with enlarging dominant abscess 3.8 cm surrounded by phlegmonous process  abutting terminal ileum. 2. Small volume right pelvic fluid. 3.  Aortic Atherosclerosis (ICD10-I70.0). Electronically Signed   By: Corlis Leak M.D.   On: 08/26/2023 09:34     Anti-infectives: Anti-infectives (From admission, onward)    Start     Dose/Rate Route Frequency Ordered Stop   08/22/23 0200  piperacillin-tazobactam (ZOSYN) IVPB 3.375 g        3.375 g 12.5 mL/hr over 240 Minutes Intravenous Every 8 hours 08/21/23 2221     08/21/23 1900  piperacillin-tazobactam (ZOSYN) IVPB 3.375 g        3.375 g 100 mL/hr over 30 Minutes Intravenous  Once 08/21/23 1853 08/21/23 2103        Assessment/Plan Perforated appendicitis with abscess - discussed with IR, unfortunately this abscess on her new CT scan today reviewed and is not amendable to drainage from IR. - WBC 11K  - will need to have further discussions regarding moving forward.  Surgery would carry risks of complications, but also patient has not improved on just zosyn at this time.  Will discuss with MD, ? Transition to Invanz to see if that helps? -long discussion with patient and husband at bedside regarding currently state.  FEN: soft as able ID: zosyn VTE: Lovenox  Per TRH: Oculopharyngeal muscular dystrophy GERD HLD hypothyroidism  I reviewed hospitalist notes, last 24 h vitals and pain scores, last 48 h intake and output, last 24 h labs and trends, and last 24 h imaging results.   LOS: 5 days   Letha Cape, [facility] 08/26/2023, 10:08 AM Please see Amion for pager number during day hours 7:00am-4:30pm

## 2023-08-26 NOTE — H&P (View-Only) (Signed)
Progress Note     Subjective: Overall feels stable, but with abdominal pain, poor appetite, still with flatus   Objective: Vital signs in last 24 hours: Temp:  [97.5 F (36.4 C)-98.2 F (36.8 C)] 98.1 F (36.7 C) (01/20 0753) Pulse Rate:  [55-60] 55 (01/20 0753) Resp:  [16-18] 16 (01/20 0753) BP: (104-117)/(53-69) 111/55 (01/20 0753) SpO2:  [96 %-98 %] 97 % (01/20 0753) Last BM Date : 08/24/23  Intake/Output from previous day: No intake/output data recorded. Intake/Output this shift: No intake/output data recorded.  PE: General: pleasant, WD, female who is laying in bed in NAD Lungs:  Respiratory effort nonlabored on room air Abd: soft, mild TTP in RLQ and some on left-side today as well, +BS, ND Psych: A&Ox3 with an appropriate affect.    Lab Results:  Recent Labs    08/25/23 0420 08/26/23 0351  WBC 12.8* 11.8*  HGB 11.4* 11.3*  HCT 33.8* 33.9*  PLT 363 413*   BMET Recent Labs    08/26/23 0351  NA 136  K 3.6  CL 99  CO2 24  GLUCOSE 213*  BUN 12  CREATININE 0.68  CALCIUM 8.8*    PT/INR No results for input(s): "LABPROT", "INR" in the last 72 hours. CMP     Component Value Date/Time   NA 136 08/26/2023 0351   K 3.6 08/26/2023 0351   CL 99 08/26/2023 0351   CO2 24 08/26/2023 0351   GLUCOSE 213 (H) 08/26/2023 0351   BUN 12 08/26/2023 0351   CREATININE 0.68 08/26/2023 0351   CALCIUM 8.8 (L) 08/26/2023 0351   PROT 6.5 08/22/2023 0224   ALBUMIN 3.0 (L) 08/22/2023 0224   AST 26 08/22/2023 0224   ALT 24 08/22/2023 0224   ALKPHOS 77 08/22/2023 0224   BILITOT 0.9 08/22/2023 0224   GFRNONAA >60 08/26/2023 0351   GFRAA  01/30/2009 0355    >60        The eGFR has been calculated using the MDRD equation. This calculation has not been validated in all clinical situations. eGFR's persistently <60 mL/min signify possible Chronic Kidney Disease.   Lipase     Component Value Date/Time   LIPASE 32 08/21/2023 1935       Studies/Results: CT  ABDOMEN PELVIS W CONTRAST Result Date: 08/26/2023 CLINICAL DATA:  Perforated appendix, rule out abscess EXAM: CT ABDOMEN AND PELVIS WITH CONTRAST TECHNIQUE: Multidetector CT imaging of the abdomen and pelvis was performed using the standard protocol following bolus administration of intravenous contrast. RADIATION DOSE REDUCTION: This exam was performed according to the departmental dose-optimization program which includes automated exposure control, adjustment of the mA and/or kV according to patient size and/or use of iterative reconstruction technique. CONTRAST:  75mL OMNIPAQUE IOHEXOL 350 MG/ML SOLN COMPARISON:  08/21/2023 FINDINGS: Lower chest: No pleural or pericardial effusion. Subsegmental linear atelectasis posteriorly at the left lung base. Hepatobiliary: No focal liver abnormality is seen. No gallstones, gallbladder wall thickening, or biliary dilatation. Pancreas: Unremarkable. No pancreatic ductal dilatation or surrounding inflammatory changes. Spleen: Normal in size without focal abnormality. Adrenals/Urinary Tract: No adrenal mass. Symmetric renal enhancement without focal lesion or hydronephrosis. Urinary bladder incompletely distended. Stomach/Bowel: Stomach is within normal limits. Appendix appears normal. No evidence of bowel wall thickening, distention, or inflammatory changes. Thick-walled enhancing appendix at the lateral margin of this process. The remainder of the colon is partially distended, unremarkable. Vascular/Lymphatic: A few enhancing right lower quadrant mesenteric lymph nodes presumably reactive. Minimal calcified aortic plaque without aneurysm. Portal vein patent. Reproductive: Uterus  unremarkable. Other: Small volume right pelvic ascites.  No free air. Musculoskeletal: Thoracolumbar levoscoliosis apex L2 with multilevel spondylitic change. Mild bilateral hip DJD. IMPRESSION: 1. Perforated acute appendicitis with enlarging dominant abscess 3.8 cm surrounded by phlegmonous process  abutting terminal ileum. 2. Small volume right pelvic fluid. 3.  Aortic Atherosclerosis (ICD10-I70.0). Electronically Signed   By: Corlis Leak M.D.   On: 08/26/2023 09:34     Anti-infectives: Anti-infectives (From admission, onward)    Start     Dose/Rate Route Frequency Ordered Stop   08/22/23 0200  piperacillin-tazobactam (ZOSYN) IVPB 3.375 g        3.375 g 12.5 mL/hr over 240 Minutes Intravenous Every 8 hours 08/21/23 2221     08/21/23 1900  piperacillin-tazobactam (ZOSYN) IVPB 3.375 g        3.375 g 100 mL/hr over 30 Minutes Intravenous  Once 08/21/23 1853 08/21/23 2103        Assessment/Plan Perforated appendicitis with abscess - discussed with IR, unfortunately this abscess on her new CT scan today reviewed and is not amendable to drainage from IR. - WBC 11K  - will need to have further discussions regarding moving forward.  Surgery would carry risks of complications, but also patient has not improved on just zosyn at this time.  Will discuss with MD, ? Transition to Invanz to see if that helps? -long discussion with patient and husband at bedside regarding currently state.  FEN: soft as able ID: zosyn VTE: Lovenox  Per TRH: Oculopharyngeal muscular dystrophy GERD HLD hypothyroidism  I reviewed hospitalist notes, last 24 h vitals and pain scores, last 48 h intake and output, last 24 h labs and trends, and last 24 h imaging results.   LOS: 5 days   Letha Cape, [facility] 08/26/2023, 10:08 AM Please see Amion for pager number during day hours 7:00am-4:30pm

## 2023-08-26 NOTE — Plan of Care (Signed)

## 2023-08-27 ENCOUNTER — Inpatient Hospital Stay (HOSPITAL_COMMUNITY): Payer: Medicare HMO

## 2023-08-27 ENCOUNTER — Other Ambulatory Visit: Payer: Self-pay

## 2023-08-27 ENCOUNTER — Encounter (HOSPITAL_COMMUNITY): Payer: Self-pay | Admitting: Internal Medicine

## 2023-08-27 ENCOUNTER — Encounter (HOSPITAL_COMMUNITY)
Admission: EM | Disposition: A | Discharge status: Home / Self Care | Payer: Self-pay | Source: Home / Self Care | Attending: Family Medicine

## 2023-08-27 DIAGNOSIS — E039 Hypothyroidism, unspecified: Secondary | ICD-10-CM | POA: Diagnosis not present

## 2023-08-27 DIAGNOSIS — K3533 Acute appendicitis with perforation and localized peritonitis, with abscess: Secondary | ICD-10-CM

## 2023-08-27 DIAGNOSIS — K3532 Acute appendicitis with perforation and localized peritonitis, without abscess: Secondary | ICD-10-CM | POA: Diagnosis not present

## 2023-08-27 DIAGNOSIS — E785 Hyperlipidemia, unspecified: Secondary | ICD-10-CM

## 2023-08-27 HISTORY — PX: DEBRIDEMENT OF ABDOMINAL WALL ABSCESS: SHX6396

## 2023-08-27 HISTORY — PX: LAPAROSCOPIC APPENDECTOMY: SHX408

## 2023-08-27 LAB — TYPE AND SCREEN
ABO/RH(D): B POS
Antibody Screen: NEGATIVE

## 2023-08-27 LAB — SURGICAL PCR SCREEN
MRSA, PCR: NEGATIVE
Staphylococcus aureus: NEGATIVE

## 2023-08-27 SURGERY — APPENDECTOMY, LAPAROSCOPIC
Anesthesia: General | Site: Abdomen

## 2023-08-27 MED ORDER — FENTANYL CITRATE (PF) 250 MCG/5ML IJ SOLN
INTRAMUSCULAR | Status: DC | PRN
  Administered 2023-08-27 (×2): 50 ug via INTRAVENOUS

## 2023-08-27 MED ORDER — PROPOFOL 500 MG/50ML IV EMUL
INTRAVENOUS | Status: DC | PRN
  Administered 2023-08-27: 125 ug/kg/min via INTRAVENOUS

## 2023-08-27 MED ORDER — CHLORHEXIDINE GLUCONATE 0.12 % MT SOLN
15.0000 mL | Freq: Once | OROMUCOSAL | Status: AC
  Administered 2023-08-27: 15 mL via OROMUCOSAL
  Filled 2023-08-27 (×2): qty 15

## 2023-08-27 MED ORDER — SUGAMMADEX SODIUM 200 MG/2ML IV SOLN
INTRAVENOUS | Status: DC | PRN
  Administered 2023-08-27 (×2): 100 mg via INTRAVENOUS

## 2023-08-27 MED ORDER — MIDAZOLAM HCL 2 MG/2ML IJ SOLN
INTRAMUSCULAR | Status: AC
Start: 2023-08-27 — End: ?
  Filled 2023-08-27: qty 2

## 2023-08-27 MED ORDER — FENTANYL CITRATE (PF) 100 MCG/2ML IJ SOLN
25.0000 ug | INTRAMUSCULAR | Status: DC | PRN
  Administered 2023-08-27: 25 ug via INTRAVENOUS

## 2023-08-27 MED ORDER — ENOXAPARIN SODIUM 30 MG/0.3ML IJ SOSY
30.0000 mg | PREFILLED_SYRINGE | INTRAMUSCULAR | Status: DC
  Administered 2023-08-28 – 2023-08-29 (×2): 30 mg via SUBCUTANEOUS
  Filled 2023-08-27 (×2): qty 0.3

## 2023-08-27 MED ORDER — MORPHINE SULFATE (PF) 2 MG/ML IV SOLN
1.0000 mg | INTRAVENOUS | Status: DC | PRN
  Administered 2023-08-28: 2 mg via INTRAVENOUS
  Administered 2023-08-28: 1 mg via INTRAVENOUS
  Filled 2023-08-27 (×2): qty 1

## 2023-08-27 MED ORDER — LACTATED RINGERS IV SOLN: INTRAVENOUS | Status: DC

## 2023-08-27 MED ORDER — BUPIVACAINE-EPINEPHRINE 0.25% -1:200000 IJ SOLN
INTRAMUSCULAR | Status: DC | PRN
  Administered 2023-08-27: 27 mL

## 2023-08-27 MED ORDER — PROPOFOL 10 MG/ML IV BOLUS
INTRAVENOUS | Status: DC | PRN
  Administered 2023-08-27: 50 mg via INTRAVENOUS

## 2023-08-27 MED ORDER — 0.9 % SODIUM CHLORIDE (POUR BTL) OPTIME
TOPICAL | Status: DC | PRN
  Administered 2023-08-27: 1000 mL

## 2023-08-27 MED ORDER — ACETAMINOPHEN 500 MG PO TABS
1000.0000 mg | ORAL_TABLET | Freq: Four times a day (QID) | ORAL | Status: DC
  Administered 2023-08-27 – 2023-08-30 (×7): 1000 mg via ORAL
  Filled 2023-08-27 (×8): qty 2

## 2023-08-27 MED ORDER — LIDOCAINE 2% (20 MG/ML) 5 ML SYRINGE
INTRAMUSCULAR | Status: DC | PRN
  Administered 2023-08-27: 40 mg via INTRAVENOUS

## 2023-08-27 MED ORDER — PROPOFOL 10 MG/ML IV BOLUS
INTRAVENOUS | Status: AC
  Filled 2023-08-27: qty 20

## 2023-08-27 MED ORDER — METHOCARBAMOL 500 MG PO TABS: 500.0000 mg | ORAL_TABLET | Freq: Three times a day (TID) | ORAL | Status: DC | PRN

## 2023-08-27 MED ORDER — FENTANYL CITRATE (PF) 100 MCG/2ML IJ SOLN
INTRAMUSCULAR | Status: AC
  Filled 2023-08-27: qty 2

## 2023-08-27 MED ORDER — ORAL CARE MOUTH RINSE: 15.0000 mL | Freq: Once | OROMUCOSAL | Status: AC

## 2023-08-27 MED ORDER — DROPERIDOL 2.5 MG/ML IJ SOLN: 0.6250 mg | Freq: Once | INTRAMUSCULAR | Status: DC | PRN

## 2023-08-27 MED ORDER — SODIUM CHLORIDE 0.9 % IR SOLN
Status: DC | PRN
  Administered 2023-08-27 (×2): 1000 mL via INTRAVESICAL

## 2023-08-27 MED ORDER — ACETAMINOPHEN 500 MG PO TABS: 1000.0000 mg | ORAL_TABLET | Freq: Four times a day (QID) | ORAL | Status: DC

## 2023-08-27 MED ORDER — POLYETHYLENE GLYCOL 3350 17 G PO PACK: 17.0000 g | PACK | Freq: Every day | ORAL | Status: DC | PRN

## 2023-08-27 MED ORDER — ONDANSETRON HCL 4 MG/2ML IJ SOLN
INTRAMUSCULAR | Status: DC | PRN
  Administered 2023-08-27: 4 mg via INTRAVENOUS

## 2023-08-27 MED ORDER — ROCURONIUM BROMIDE 50 MG/5ML IV SOSY
PREFILLED_SYRINGE | INTRAVENOUS | Status: DC | PRN
  Administered 2023-08-27: 30 mg via INTRAVENOUS

## 2023-08-27 MED ORDER — MIDAZOLAM HCL 2 MG/2ML IJ SOLN
INTRAMUSCULAR | Status: DC | PRN
Start: 2023-08-27 — End: 2023-08-27
  Administered 2023-08-27: 2 mg via INTRAVENOUS

## 2023-08-27 MED ORDER — FENTANYL CITRATE (PF) 250 MCG/5ML IJ SOLN
INTRAMUSCULAR | Status: AC
  Filled 2023-08-27: qty 5

## 2023-08-27 MED ORDER — BUPIVACAINE-EPINEPHRINE (PF) 0.25% -1:200000 IJ SOLN
INTRAMUSCULAR | Status: AC
  Filled 2023-08-27: qty 30

## 2023-08-27 MED ORDER — PHENYLEPHRINE 80 MCG/ML (10ML) SYRINGE FOR IV PUSH (FOR BLOOD PRESSURE SUPPORT)
PREFILLED_SYRINGE | INTRAVENOUS | Status: DC | PRN
  Administered 2023-08-27: 120 ug via INTRAVENOUS

## 2023-08-27 MED ORDER — DEXAMETHASONE SODIUM PHOSPHATE 10 MG/ML IJ SOLN
INTRAMUSCULAR | Status: DC | PRN
  Administered 2023-08-27: 4 mg via INTRAVENOUS

## 2023-08-27 SURGICAL SUPPLY — 80 items
APPLICATOR ARISTA FLEXITIP XL (MISCELLANEOUS) IMPLANT
APPLICATOR COTTON TIP 6 STRL (MISCELLANEOUS) ×4 IMPLANT
APPLICATOR COTTON TIP 6IN STRL (MISCELLANEOUS) ×4 IMPLANT
APPLIER CLIP ROT 10 11.4 M/L (STAPLE)
BAG COUNTER SPONGE SURGICOUNT (BAG) ×2 IMPLANT
BIOPATCH WHT 1IN DISK W/4.0 H (GAUZE/BANDAGES/DRESSINGS) IMPLANT
BLADE CLIPPER SURG (BLADE) IMPLANT
CANISTER SUCT 3000ML PPV (MISCELLANEOUS) ×2 IMPLANT
CHLORAPREP W/TINT 26 (MISCELLANEOUS) ×2 IMPLANT
CLIP APPLIE ROT 10 11.4 M/L (STAPLE) IMPLANT
COVER SURGICAL LIGHT HANDLE (MISCELLANEOUS) ×2 IMPLANT
CUTTER ECHEON FLEX ENDO 45 340 (ENDOMECHANICALS) IMPLANT
CUTTER FLEX LINEAR 45M (STAPLE) ×2 IMPLANT
DERMABOND ADVANCED .7 DNX12 (GAUZE/BANDAGES/DRESSINGS) IMPLANT
DRAIN CHANNEL 19F RND (DRAIN) IMPLANT
DRAPE LAPAROSCOPIC ABDOMINAL (DRAPES) ×2 IMPLANT
DRAPE WARM FLUID 44X44 (DRAPES) ×2 IMPLANT
DRSG OPSITE POSTOP 4X10 (GAUZE/BANDAGES/DRESSINGS) IMPLANT
DRSG OPSITE POSTOP 4X8 (GAUZE/BANDAGES/DRESSINGS) IMPLANT
DRSG TEGADERM 4X4.75 (GAUZE/BANDAGES/DRESSINGS) IMPLANT
ELECT BLADE 6.5 EXT (BLADE) IMPLANT
ELECT CAUTERY BLADE 6.4 (BLADE) ×2 IMPLANT
ELECT REM PT RETURN 9FT ADLT (ELECTROSURGICAL) ×2
ELECTRODE REM PT RTRN 9FT ADLT (ELECTROSURGICAL) ×2 IMPLANT
ENDOLOOP SUT PDS II 0 18 (SUTURE) IMPLANT
EVACUATOR SILICONE 100CC (DRAIN) IMPLANT
GAUZE SPONGE 2X2 8PLY STRL LF (GAUZE/BANDAGES/DRESSINGS) IMPLANT
GLOVE PI ORTHO PRO STRL 7.5 (GLOVE) ×2 IMPLANT
GOWN SPEC L4 XLG W/TWL (GOWN DISPOSABLE) ×2 IMPLANT
GOWN STRL REUS W/ TWL LRG LVL3 (GOWN DISPOSABLE) ×4 IMPLANT
GOWN STRL REUS W/ TWL XL LVL3 (GOWN DISPOSABLE) ×2 IMPLANT
GRASPER SUT TROCAR 14GX15 (MISCELLANEOUS) IMPLANT
HANDLE SUCTION POOLE (INSTRUMENTS) ×2 IMPLANT
HEMOSTAT ARISTA ABSORB 3G PWDR (HEMOSTASIS) IMPLANT
IRRIG SUCT STRYKERFLOW 2 WTIP (MISCELLANEOUS) ×2
IRRIGATION SUCT STRKRFLW 2 WTP (MISCELLANEOUS) ×2 IMPLANT
KIT BASIN OR (CUSTOM PROCEDURE TRAY) ×2 IMPLANT
KIT TURNOVER KIT B (KITS) ×2 IMPLANT
LIGASURE IMPACT 36 18CM CVD LR (INSTRUMENTS) IMPLANT
NS IRRIG 1000ML POUR BTL (IV SOLUTION) ×4 IMPLANT
PACK GENERAL/GYN (CUSTOM PROCEDURE TRAY) ×2 IMPLANT
PAD ARMBOARD 7.5X6 YLW CONV (MISCELLANEOUS) ×4 IMPLANT
PENCIL SMOKE EVACUATOR (MISCELLANEOUS) ×2 IMPLANT
POUCH RETRIEVAL ECOSAC 10 (ENDOMECHANICALS) IMPLANT
RELOAD 45 VASCULAR/THIN (ENDOMECHANICALS) IMPLANT
RELOAD STAPLE 45 2.5 WHT GRN (ENDOMECHANICALS) IMPLANT
RELOAD STAPLE 45 3.5 BLU ETS (ENDOMECHANICALS) IMPLANT
RELOAD STAPLE 45 3.6 BLU REG (STAPLE) IMPLANT
RELOAD STAPLE TA45 3.5 REG BLU (ENDOMECHANICALS) IMPLANT
SCISSORS LAP 5X35 DISP (ENDOMECHANICALS) IMPLANT
SET TUBE SMOKE EVAC HIGH FLOW (TUBING) ×2 IMPLANT
SHEARS HARMONIC ACE PLUS 36CM (ENDOMECHANICALS) ×2 IMPLANT
SLEEVE ADV FIXATION 5X100MM (TROCAR) IMPLANT
SLEEVE Z-THREAD 5X100MM (TROCAR) ×2 IMPLANT
SPECIMEN JAR LARGE (MISCELLANEOUS) IMPLANT
SPONGE T-LAP 18X18 ~~LOC~~+RFID (SPONGE) IMPLANT
STAPLE RELOAD 45MM BLUE (STAPLE) ×2 IMPLANT
STAPLER VISISTAT 35W (STAPLE) ×2 IMPLANT
STRIP CLOSURE SKIN 1/2X4 (GAUZE/BANDAGES/DRESSINGS) IMPLANT
SUCTION POOLE HANDLE (INSTRUMENTS) ×2
SUT ETHILON 2 0 FS 18 (SUTURE) IMPLANT
SUT MNCRL AB 4-0 PS2 18 (SUTURE) ×2 IMPLANT
SUT PDS AB 1 TP1 96 (SUTURE) ×4 IMPLANT
SUT SILK 2 0 SH CR/8 (SUTURE) ×2 IMPLANT
SUT SILK 2 0 TIES 10X30 (SUTURE) ×2 IMPLANT
SUT SILK 3 0 SH CR/8 (SUTURE) ×2 IMPLANT
SUT SILK 3 0 TIES 10X30 (SUTURE) ×2 IMPLANT
SUT VIC AB 3-0 SH 18 (SUTURE) IMPLANT
SUT VICRYL 0 UR6 27IN ABS (SUTURE) IMPLANT
TOWEL GREEN STERILE (TOWEL DISPOSABLE) ×2 IMPLANT
TOWEL GREEN STERILE FF (TOWEL DISPOSABLE) ×2 IMPLANT
TRAY FOLEY MTR SLVR 16FR STAT (SET/KITS/TRAYS/PACK) IMPLANT
TRAY FOLEY W/BAG SLVR 16FR ST (SET/KITS/TRAYS/PACK) IMPLANT
TRAY LAPAROSCOPIC MC (CUSTOM PROCEDURE TRAY) ×2 IMPLANT
TROCAR ADV FIXATION 5X100MM (TROCAR) IMPLANT
TROCAR BALLN 12MMX100 BLUNT (TROCAR) ×2 IMPLANT
TROCAR Z-THREAD OPTICAL 5X100M (TROCAR) ×2 IMPLANT
WARMER LAPAROSCOPE (MISCELLANEOUS) ×2 IMPLANT
WATER STERILE IRR 1000ML POUR (IV SOLUTION) ×2 IMPLANT
YANKAUER SUCT BULB TIP NO VENT (SUCTIONS) IMPLANT

## 2023-08-27 NOTE — Progress Notes (Signed)
PROGRESS NOTE    [name]  ZOX:[id number] DOB: 1951/03/27 DOA: 08/21/2023 PCP: Assunta Found, MD   Brief Narrative: [name] is a 73 y.o. female with a history of hyperlipidemia and hypothyroidism.  Patient presented secondary to abdominal pain with evidence of a perforated appendix with abscess formation. Empiric antibiotics started. General surgery consulted and plan surgery 1/21.   Assessment/Plan:  Perforated appendix Concern for developing abscess. General surgery consulted with recommendation for medical management with continued IV antibiotics. WBC with rise and has now stabilized. Repeat CT abdomen/pelvis (1/20) significant for enlarging abscess measuring 3.8 cm. IR consulted for consideration of drain placement, however no access for drain placement identified. CRP and ESR elevated. -General surgery recommendations: IV antibiotics, surgical management  Hypothyroidism -Continue Synthroid  Hyperlipidemia Aortic atherosclerosis -Continue Lipitor  GERD -Continue Protonix  Osteoporosis Patient is on Fosamax, calcium and vitamin D supplementation as an outpatient.  Depression -Continue Lexapro  Memory impairment Noted.   DVT prophylaxis: SCDs Code Status:   Code Status: Full Code Family Communication: Husband at bedside Disposition Plan: Discharge pending on-going general surgery recommendations/management   Consultants:  General surgery Interventional radiology  Procedures:  None  Antimicrobials: Zosyn IV    Subjective: Still with abdominal pain. No nausea/vomiting. Afebrile.  Objective: BP 114/65   Pulse 73   Temp 98.6 F (37 C) (Oral)   Resp 16   Ht 5\' 1"  (1.549 m)   Wt 49 kg   SpO2 93%   BMI 20.41 kg/m   Examination:  General exam: Appears calm and comfortable Respiratory system: Clear to auscultation. Respiratory effort normal. Cardiovascular system: S1 & S2 heard, RRR. Gastrointestinal system: Abdomen is nondistended,  soft and tender in RLQ. Normal bowel sounds heard. Central nervous system: Alert and oriented. No focal neurological deficits. Psychiatry: Judgement and insight appear normal. Mood & affect appropriate.    Data Reviewed: I have personally reviewed following labs and imaging studies   Last CBC Lab Results  Component Value Date   WBC 11.8 (H) 08/26/2023   HGB 11.3 (L) 08/26/2023   HCT 33.9 (L) 08/26/2023   MCV 90.4 08/26/2023   MCH 30.1 08/26/2023   RDW 13.2 08/26/2023   PLT 413 (H) 08/26/2023     Last metabolic panel Lab Results  Component Value Date   GLUCOSE 213 (H) 08/26/2023   NA 136 08/26/2023   K 3.6 08/26/2023   CL 99 08/26/2023   CO2 24 08/26/2023   BUN 12 08/26/2023   CREATININE 0.68 08/26/2023   GFRNONAA >60 08/26/2023   CALCIUM 8.8 (L) 08/26/2023   PROT 6.5 08/22/2023   ALBUMIN 3.0 (L) 08/22/2023   BILITOT 0.9 08/22/2023   ALKPHOS 77 08/22/2023   AST 26 08/22/2023   ALT 24 08/22/2023   ANIONGAP 13 08/26/2023     Creatinine Clearance: Estimated Creatinine Clearance: 48 mL/min (by C-G formula based on SCr of 0.68 mg/dL).  Recent Results (from the past 240 hours)  Culture, blood (routine x 2)     Status: None   Collection Time: 08/21/23  6:53 PM   Specimen: BLOOD  Result Value Ref Range Status   Specimen Description BLOOD SITE NOT SPECIFIED  Final   Special Requests   Final    BOTTLES DRAWN AEROBIC AND ANAEROBIC Blood Culture results may not be optimal due to an inadequate volume of blood received in culture bottles   Culture   Final    NO GROWTH 5 DAYS Performed at [facility], [address] [place], [place] [address]    Report Status 08/26/2023 FINAL  Final  Culture, blood (routine x 2)     Status: None   Collection Time: 08/21/23  6:58 PM   Specimen: BLOOD  Result Value Ref Range Status   Specimen Description BLOOD SITE NOT SPECIFIED  Final   Special Requests   Final    BOTTLES DRAWN AEROBIC AND ANAEROBIC Blood Culture results may not  be optimal due to an inadequate volume of blood received in culture bottles   Culture   Final    NO GROWTH 5 DAYS Performed at [facility], [address], [place], [place] [address]    Report Status 08/26/2023 FINAL  Final      Radiology Studies: CT ABDOMEN PELVIS W CONTRAST Result Date: 08/26/2023 CLINICAL DATA:  Perforated appendix, rule out abscess EXAM: CT ABDOMEN AND PELVIS WITH CONTRAST TECHNIQUE: Multidetector CT imaging of the abdomen and pelvis was performed using the standard protocol following bolus administration of intravenous contrast. RADIATION DOSE REDUCTION: This exam was performed according to the departmental dose-optimization program which includes automated exposure control, adjustment of the mA and/or kV according to patient size and/or use of iterative reconstruction technique. CONTRAST:  75mL OMNIPAQUE IOHEXOL 350 MG/ML SOLN COMPARISON:  08/21/2023 FINDINGS: Lower chest: No pleural or pericardial effusion. Subsegmental linear atelectasis posteriorly at the left lung base. Hepatobiliary: No focal liver abnormality is seen. No gallstones, gallbladder wall thickening, or biliary dilatation. Pancreas: Unremarkable. No pancreatic ductal dilatation or surrounding inflammatory changes. Spleen: Normal in size without focal abnormality. Adrenals/Urinary Tract: No adrenal mass. Symmetric renal enhancement without focal lesion or hydronephrosis. Urinary bladder incompletely distended. Stomach/Bowel: Stomach is within normal limits. Appendix appears normal. No evidence of bowel wall thickening, distention, or inflammatory changes. Thick-walled enhancing appendix at the lateral margin of this process. The remainder of the colon is partially distended, unremarkable. Vascular/Lymphatic: A few enhancing right lower quadrant mesenteric lymph nodes presumably reactive. Minimal calcified aortic plaque without aneurysm. Portal vein patent. Reproductive: Uterus unremarkable. Other: Small volume  right pelvic ascites.  No free air. Musculoskeletal: Thoracolumbar levoscoliosis apex L2 with multilevel spondylitic change. Mild bilateral hip DJD. IMPRESSION: 1. Perforated acute appendicitis with enlarging dominant abscess 3.8 cm surrounded by phlegmonous process abutting terminal ileum. 2. Small volume right pelvic fluid. 3.  Aortic Atherosclerosis (ICD10-I70.0). Electronically Signed   By: Corlis Leak M.D.   On: 08/26/2023 09:34       LOS: 6 days    Jacquelin Hawking, MD [facility] 08/27/2023, 8:41 AM   If 7PM-7AM, please contact night-coverage [email or web address]

## 2023-08-27 NOTE — Anesthesia Preprocedure Evaluation (Addendum)
Anesthesia Evaluation  Patient identified by MRN, date of birth, ID band Patient awake    Reviewed: Allergy & Precautions, H&P , NPO status , Patient's Chart, lab work & pertinent test results  Airway Mallampati: II  TM Distance: >3 FB Neck ROM: Full    Dental  (+) Partial Upper   Pulmonary former smoker   Pulmonary exam normal breath sounds clear to auscultation       Cardiovascular negative cardio ROS Normal cardiovascular exam Rhythm:Regular Rate:Normal     Neuro/Psych Muscular dystrophy, oculopharyngeal MD  Neuromuscular disease  negative psych ROS   GI/Hepatic Neg liver ROS,GERD  ,,perforated appendicitis   Endo/Other  Hypothyroidism    Renal/GU negative Renal ROS  negative genitourinary   Musculoskeletal negative musculoskeletal ROS (+)    Abdominal   Peds negative pediatric ROS (+)  Hematology negative hematology ROS (+)   Anesthesia Other Findings   Reproductive/Obstetrics negative OB ROS                             Anesthesia Physical Anesthesia Plan  ASA: 4  Anesthesia Plan: General   Post-op Pain Management:    Induction: Intravenous  PONV Risk Score and Plan: 3 and TIVA and Propofol infusion  Airway Management Planned: Oral ETT  Additional Equipment:   Intra-op Plan:   Post-operative Plan: Extubation in OR  Informed Consent: I have reviewed the patients History and Physical, chart, labs and discussed the procedure including the risks, benefits and alternatives for the proposed anesthesia with the patient or authorized representative who has indicated his/her understanding and acceptance.     Dental advisory given  Plan Discussed with: CRNA  Anesthesia Plan Comments:        Anesthesia Quick Evaluation

## 2023-08-27 NOTE — Plan of Care (Signed)

## 2023-08-27 NOTE — Op Note (Signed)
[name] [id number] 12-Jan-1951 08/27/2023  Appendectomy, Lap,; laparoscopic drainage of intra-abdominal abscess procedure Note  Indications: The patient presented with a history of right-sided abdominal pain. A CT revealed findings consistent with acute perforated appendicitis with a small abscess.  He was initially managed with nonoperative management including IV antibiotics and initial bowel rest.  She was then started on a diet.  She remained afebrile but she continued to have abdominal discomfort.  A repeat CT showed persistent perforated appendicitis with an enlarging abscess which was not amendable to percutaneous drainage.  Therefore I recommended laparoscopic appendectomy possible open with possible bowel resection.  Please see chart for additional details.  Pre-operative Diagnosis: acute perforated appendicitis with abscess  Post-operative Diagnosis: Same  Surgeon: Gaynelle Adu MD FACS  Assistants: none  Anesthesia: General endotracheal anesthesia   Procedure Details  The patient was seen again in the Holding Room. The risks, benefits, complications, treatment options, and expected outcomes were discussed with the patient and/or family. The possibilities of perforation of viscus, bleeding, recurrent infection, the need for additional procedures, failure to diagnose a condition, and creating a complication requiring transfusion or operation were discussed. There was concurrence with the proposed plan and informed consent was obtained. The site of surgery was properly noted. The patient was taken to Operating Room, identified as Eulogio Ditch and the procedure verified as Appendectomy. A Time Out was held and the above information confirmed.  The patient was placed in the supine position and general anesthesia was induced, along with placement of orogastric tube, SCDs.  A Foley catheter was placed. The abdomen was prepped and draped in a sterile fashion. A 1.5 centimeter  infraumbilical incision was made.  The umbilical stalk was elevated, and the midline fascia was incised with a #11 blade.  A Kelly clamp was used to confirm entrance into the peritoneal cavity.  A pursestring suture was passed around the incision with a 0 Vicryl.  The fascia was very thin.  A 12mm Hasson was introduced into the abdomen and the tails of the suture were used to hold the Hasson in place.   The pneumoperitoneum was then established to steady pressure of 15 mmHg.  Additional 5 mm cannulas then placed in the left lower quadrant of the abdomen and the suprapubic region under direct visualization. A careful evaluation of the entire abdomen was carried out. The patient was placed in Trendelenburg and left lateral decubitus position. The small intestines were retracted in the cephalad and left lateral direction away from the pelvis and right lower quadrant.  The terminal ileum mesentery was adhered to the right pelvic inlet.  When I peeled back the terminal ileum this revealed a large abscess which drained into the pelvis.  There was probably about 20 to 30 cc of frank pus.  Interestingly the right adnexal structures were pulled up into the right pelvic inlet.  The abscess cavity was very raw.  Identified the base of the appendix as it entered the cecum.  The distal appendix was densely adhered and plastered to the right adnexal structures.  I was able to mobilize the tip of the appendix off of the adnexal structures.  The appendix was inflamed and perforated.    The appendix was carefully dissected. The appendix was was skeletonized with the harmonic scalpel.   The appendix was divided at its base using an Echelon 60 mm endo-GIA stapler with a blue load.  I stapled parallel to of where the terminal ileum entered the cecum.  No appendiceal stump was left in place. The appendix was removed from the abdomen with an Ecco bag through the umbilical port.  There was no evidence of bleeding, leakage, or  complication after division of the appendix. Irrigation was also performed and irrigate suctioned from the abdomen as well.  The abscess cavity and right pelvic inlet was just raw.  There was no obvious bleeding source.  I decided to place Arista hemostatic powder.  A 19 round JP drain was placed in the abdominal cavity and then brought out through the right lower quadrant and secured to the skin with a 2-0 nylon suture.  The drain was placed around the abscess cavity extending into the pelvis.  The umbilical port site was closed with the purse string suture.  An additional interrupted 0 Vicryl was placed at the umbilical fascia with PMI suture passer with laparoscopic guidance.  The closure was viewed laparoscopically. There was no residual palpable fascial defect.  The trocar site skin wounds were closed with 4-0 Monocryl.  Dermabond was applied to the skin incisions.  Instrument, sponge, and needle counts were correct at the conclusion of the case.   Findings: The appendix was found to be inflamed. There were not signs of necrosis.  There was perforation. There was abscess formation.  CASE DATA: Type of patient?: DOW CASE (Surgical Hospitalist [facility]) Status of Case? URGENT Add On Infection Present At Time Of Surgery (PATOS)?   Abscess and pus    Estimated Blood Loss:  Minimal         Drains: 19 round         Specimens: appendix         Complications:  None; patient tolerated the procedure well.         Disposition: PACU - hemodynamically stable.         Condition: stable  Maida Sella. Andrey Campanile, MD, FACS General, Bariatric, & Minimally Invasive Surgery [facility],  [facility]

## 2023-08-27 NOTE — Interval H&P Note (Signed)
History and Physical Interval Note:  08/27/2023 9:00 AM  [name]  has presented today for surgery, with the diagnosis of perforated appendicitis.  The various methods of treatment have been discussed with the patient and family. After consideration of risks, benefits and other options for treatment, the patient has consented to  Procedure(s): APPENDECTOMY LAPAROSCOPIC (N/A) POSSIBLE OPEN APPENDECTOMY (N/A) POSSIBLE BOWEL RESECTION (N/A) as a surgical intervention.  The patient's history has been reviewed, patient examined, no change in status, stable for surgery.  I have reviewed the patient's chart and labs.  Questions were answered to the patient's satisfaction.     Gaynelle Adu

## 2023-08-27 NOTE — Progress Notes (Signed)
PT Cancellation Note  Patient Details Name: [name] MRN: [id number] DOB: January 16, 1951   Cancelled Treatment:    Reason Eval/Treat Not Completed: Patient at procedure or test/unavailable Pt noted to be OTF at procedure. Will f/u as able.  Aleda Grana, PT, DPT 08/27/23, 9:55 AM   Sandi Mariscal 08/27/2023, 9:55 AM

## 2023-08-27 NOTE — Anesthesia Procedure Notes (Signed)
Procedure Name: Intubation Date/Time: 08/27/2023 9:35 AM  Performed by: Adria Dill, CRNAPre-anesthesia Checklist: Patient identified, Emergency Drugs available, Suction available and Patient being monitored Patient Re-evaluated:Patient Re-evaluated prior to induction Oxygen Delivery Method: Circle system utilized Preoxygenation: Pre-oxygenation with 100% oxygen Induction Type: IV induction Ventilation: Mask ventilation without difficulty Laryngoscope Size: Miller and 2 Grade View: Grade I Tube type: Oral Tube size: 7.0 mm Number of attempts: 1 Airway Equipment and Method: Stylet and Oral airway Placement Confirmation: ETT inserted through vocal cords under direct vision, positive ETCO2 and breath sounds checked- equal and bilateral Secured at: 21 cm Tube secured with: Tape Dental Injury: Teeth and Oropharynx as per pre-operative assessment

## 2023-08-27 NOTE — Transfer of Care (Signed)
Immediate Anesthesia Transfer of Care Note  Patient: [name]  Procedure(s) Performed: APPENDECTOMY LAPAROSCOPIC DEBRIDEMENT OF INTRABDOMINAL ABSCESS (Abdomen)  Patient Location: PACU  Anesthesia Type:General  Level of Consciousness: drowsy and patient cooperative  Airway & Oxygen Therapy: Patient Spontanous Breathing and Patient connected to nasal cannula oxygen  Post-op Assessment: Report given to RN and Post -op Vital signs reviewed and stable  Post vital signs: Reviewed and stable  Last Vitals:  Vitals Value Taken Time  BP    Temp    Pulse    Resp    SpO2      Last Pain:  Vitals:   08/27/23 0816  TempSrc: Oral  PainSc: 4          Complications: No notable events documented.

## 2023-08-27 NOTE — Anesthesia Postprocedure Evaluation (Signed)
Anesthesia Post Note  Patient: [name]  Procedure(s) Performed: APPENDECTOMY LAPAROSCOPIC DEBRIDEMENT OF INTRABDOMINAL ABSCESS (Abdomen)     Patient location during evaluation: PACU Anesthesia Type: General Level of consciousness: awake and alert Pain management: pain level controlled Vital Signs Assessment: post-procedure vital signs reviewed and stable Respiratory status: spontaneous breathing, nonlabored ventilation, respiratory function stable and patient connected to nasal cannula oxygen Cardiovascular status: blood pressure returned to baseline and stable Postop Assessment: no apparent nausea or vomiting Anesthetic complications: no   No notable events documented.  Last Vitals:  Vitals:   08/27/23 1135 08/27/23 1214  BP: (!) 154/66 (!) 151/70  Pulse: (!) 57 (!) 58  Resp: 14 18  Temp: 36.4 C 36.7 C  SpO2: 97% 96%    Last Pain:  Vitals:   08/27/23 1214  TempSrc: Oral  PainSc:                  [place]

## 2023-08-28 ENCOUNTER — Encounter (HOSPITAL_COMMUNITY): Payer: Self-pay | Admitting: General Surgery

## 2023-08-28 DIAGNOSIS — K3532 Acute appendicitis with perforation and localized peritonitis, without abscess: Secondary | ICD-10-CM | POA: Diagnosis not present

## 2023-08-28 LAB — COMPREHENSIVE METABOLIC PANEL
ALT: 23 U/L (ref 0–44)
AST: 22 U/L (ref 15–41)
Albumin: 2.5 g/dL — ABNORMAL LOW (ref 3.5–5.0)
Alkaline Phosphatase: 66 U/L (ref 38–126)
Anion gap: 13 (ref 5–15)
BUN: 9 mg/dL (ref 8–23)
CO2: 22 mmol/L (ref 22–32)
Calcium: 8.6 mg/dL — ABNORMAL LOW (ref 8.9–10.3)
Chloride: 100 mmol/L (ref 98–111)
Creatinine, Ser: 0.61 mg/dL (ref 0.44–1.00)
GFR, Estimated: >60 mL/min (ref >60)
Glucose, Bld: 94 mg/dL (ref 70–99)
Potassium: 3.6 mmol/L (ref 3.5–5.1)
Sodium: 135 mmol/L (ref 135–145)
Total Bilirubin: 0.5 mg/dL (ref 0.0–1.2)
Total Protein: 6.7 g/dL (ref 6.5–8.1)

## 2023-08-28 LAB — SURGICAL PATHOLOGY

## 2023-08-28 LAB — CBC
HCT: 41 % (ref 36.0–46.0)
Hemoglobin: 13.7 g/dL (ref 12.0–15.0)
MCH: 29.9 pg (ref 26.0–34.0)
MCHC: 33.4 g/dL (ref 30.0–36.0)
MCV: 89.5 fL (ref 80.0–100.0)
Platelets: 557 10*3/uL — ABNORMAL HIGH (ref 150–400)
RBC: 4.58 MIL/uL (ref 3.87–5.11)
RDW: 13.2 % (ref 11.5–15.5)
WBC: 18.3 10*3/uL — ABNORMAL HIGH (ref 4.0–10.5)
nRBC: 0 % (ref 0.0–0.2)

## 2023-08-28 MED ORDER — SIMETHICONE 80 MG PO CHEW
80.0000 mg | CHEWABLE_TABLET | Freq: Four times a day (QID) | ORAL | Status: DC | PRN
  Administered 2023-08-28 – 2023-08-30 (×2): 80 mg via ORAL
  Filled 2023-08-28 (×2): qty 1

## 2023-08-28 NOTE — Progress Notes (Signed)
Occupational Therapy Treatment Patient Details Name: [name] MRN: [id number] DOB: 09-13-1950 Today's Date: 08/28/2023   History of present illness The pt is a 73 yo female presenting 1/15 with abdominal pain, found to have perforated appendix with possible developing abscess, admitted for management. Now s/p laparoscopic appendectomy and debridement of intraabdominal abscess. PMH includes: HLD, hypothyroidism, muscular dystrophy, and oculopharyngeal muscular dystrophy.   OT comments  Now s/p appendectomy with pt reporting pain but continues to perform well with mobility. Pt with some mild confusion this morning and decreased memory suspect secondary to having been under anesthesia for surgery. Introduced log roll technique to optimize comfort with bed mobility and pt performing with min A. Performing transfers with Min A -CGA. Pt needing CGA for ambulation to restroom. Able to don socks min A as well with pt reaching toward feet despite encouragement to attempt figure 4. Pending husband ability to help at home, continue to recommend HHOT.       If plan is discharge home, recommend the following:  A little help with walking and/or transfers;A little help with bathing/dressing/bathroom;Assistance with cooking/housework;Assist for transportation;Direct supervision/assist for financial management;Direct supervision/assist for medications management   Equipment Recommendations  None recommended by OT    Recommendations for Other Services      Precautions / Restrictions Precautions Precautions: Fall Precaution Comments: hx of 2 falls in last month Restrictions Weight Bearing Restrictions Per Provider Order: No       Mobility Bed Mobility Overal bed mobility: Needs Assistance Bed Mobility: Rolling, Sidelying to Sit Rolling: Min assist Sidelying to sit: Contact guard assist       General bed mobility comments: min cues for new learning. Min A for roll onto R side prior to  initiating rise.    Transfers Overall transfer level: Needs assistance Equipment used: Rolling walker (2 wheels) Transfers: Sit to/from Stand Sit to Stand: Contact guard assist, Min assist           General transfer comment: for safety up from EOB; min A up from low toilet     Balance Overall balance assessment: Needs assistance Sitting-balance support: No upper extremity supported, Feet supported Sitting balance-Leahy Scale: Good     Standing balance support: Single extremity supported Standing balance-Leahy Scale: Poor                             ADL either performed or assessed with clinical judgement   ADL Overall ADL's : Needs assistance/impaired Eating/Feeding: Independent;Bed level Eating/Feeding Details (indicate cue type and reason): trying to eat some on arrival but pt with limited appetite Grooming: Contact guard assist;Standing;Wash/dry hands               Lower Body Dressing: Sitting/lateral leans;Minimal assistance Lower Body Dressing Details (indicate cue type and reason): donning socks. Encouraged figure 4 but pt continues to lean forward to feet with mild discomfort noted. Pt with notably decreased dexterity which is likely her baseline Toilet Transfer: Contact guard assist;Rolling walker (2 wheels)           Functional mobility during ADLs: Contact guard assist;Rolling walker (2 wheels)      Extremity/Trunk Assessment              Vision       Perception     Praxis      Cognition Arousal: Alert Behavior During Therapy: WFL for tasks assessed/performed  General Comments: a bit foggy this morning with pt unsure what day it is and unaware she has drain. Following all commands [facility]        Exercises      Shoulder Instructions       General Comments      Pertinent Vitals/ Pain       Pain Assessment Pain Assessment: Faces Faces Pain Scale: Hurts even more Pain  Location: abdomen/surgical site Pain Descriptors / Indicators: Aching, Sore, Tender Pain Intervention(s): Limited activity within patient's tolerance, Monitored during session  Home Living                                          Prior Functioning/Environment              Frequency  Min 1X/week        Progress Toward Goals  OT Goals(current goals can now be found in the care plan section)  Progress towards OT goals: Progressing toward goals  Acute Rehab OT Goals Patient Stated Goal: get better OT Goal Formulation: With patient/family Time For Goal Achievement: 09/06/23 Potential to Achieve Goals: Good ADL Goals Pt Will Perform Grooming: Independently;standing Pt Will Perform Upper Body Bathing: Independently;sitting;standing Pt Will Perform Lower Body Bathing: Independently;sit to/from stand Pt Will Perform Upper Body Dressing: Independently;sitting Pt Will Perform Lower Body Dressing: Independently;sit to/from stand Pt Will Transfer to Toilet: Independently;ambulating;regular height toilet Pt Will Perform Toileting - Clothing Manipulation and hygiene: Independently;sit to/from stand  Plan      Co-evaluation                 AM-PAC OT "6 Clicks" Daily Activity     Outcome Measure   Help from another person eating meals?: None Help from another person taking care of personal grooming?: A Little Help from another person toileting, which includes using toliet, bedpan, or urinal?: A Little Help from another person bathing (including washing, rinsing, drying)?: A Little Help from another person to put on and taking off regular upper body clothing?: A Little Help from another person to put on and taking off regular lower body clothing?: A Little 6 Click Score: 19    End of Session Equipment Utilized During Treatment: Rolling walker (2 wheels)  OT Visit Diagnosis: Other abnormalities of gait and mobility (R26.89);Unsteadiness on feet  (R26.81);Muscle weakness (generalized) (M62.81);Low vision, both eyes (H54.2);Pain Pain - part of body:  (abdomen)   Activity Tolerance Patient tolerated treatment well   Patient Left in chair;with call bell/phone within reach;Other (comment) (PA in room)   Nurse Communication Mobility status        Time: 8295-6213 OT Time Calculation (min): 25 min  Charges: OT General Charges $OT Visit: 1 Visit OT Treatments $Self Care/Home Management : 23-37 mins  [name], OTR/L [facility] Acute Rehabilitation Office: [phone]   [name] 08/28/2023, 9:56 AM

## 2023-08-28 NOTE — Progress Notes (Signed)
TR[facility] night cross cover note:   I was notified by RN of the patient's request for medication for gas pain.  I subsequently placed order for as needed simethicone for this purpose.    Newton Pigg, DO Hospitalist

## 2023-08-28 NOTE — TOC Progression Note (Signed)
Transition of Care [facility]) - Progression Note    Patient Details  Name: [name] MRN: [id number] Date of Birth: [birth year]  Transition of [facility]) CM/SW Contact  Marliss Coots, LCSW Phone Number: 08/28/2023, 10:27 AM  Clinical Narrative:     10:28 AM Per progressions and chart review, patient's appendix has been removed and remains on IV antibiotics. Physical therapy recommended outpatient physical therapy (neuro for balance) upon discharge. TOC will continue to follow.   Expected Discharge Plan: OP Rehab Barriers to Discharge: Continued Medical Work up  Expected Discharge Plan and Services In-house Referral: Clinical Social Work     Living arrangements for the past 2 months: Single Family Home                                       Social Determinants of Health (SDOH) Interventions SDOH Screenings   Food Insecurity: No Food Insecurity (08/22/2023)  Housing: Low Risk  (08/22/2023)  Transportation Needs: No Transportation Needs (08/22/2023)  Utilities: Not At Risk (08/22/2023)  Depression (PHQ2-9): Low Risk  (04/05/2021)  Social Connections: Socially Isolated (08/22/2023)  Tobacco Use: Medium Risk (08/27/2023)    Readmission Risk Interventions     No data to display

## 2023-08-28 NOTE — Progress Notes (Signed)
PROGRESS NOTE  [name]  DOB: May 06, 1951  PCP: Assunta Found, MD WUJ:[id number]  DOA: 08/21/2023  LOS: 7 days  Hospital Day: 8  Brief narrative: [name] is a 73 y.o. female with PMH significant for HLD, hypothyroidism, GERD, muscular dystrophy. Patient was having abdominal pain for few days prior to presentation.  She was seen by her PCP as an outpatient who obtained a CT abdomen that suggested possible perforated appendix with abscess formation and hence patient was sent to the ED on 1/15. Initial labs showed leukocytosis General surgery was consulted Patient was admitted to [facility] for conservative management with IV antibiotics Initially improved but started to worsen again. 1/21, patient underwent laparoscopic appendectomy and drainage of intra-abdominal abscess.  Subjective: Patient was seen and examined this morning. Pleasant elderly Caucasian female.  Sitting up in recliner.  Pain controlled. Serosanguineous drainage from JP drain  Assessment and plan: Perforated appendix S/p laparoscopic appendectomy and drainage of abscess -1/21 Dr. Andrey Campanile General Surgery following  Continue IV antibiotics  Clear liquid diet today.   Hypothyroidism Continue Synthroid   Hyperlipidemia Aortic atherosclerosis Continue Lipitor   GERD Continue Protonix   Osteoporosis Patient is on Fosamax, calcium and vitamin D supplementation as an outpatient.   Depression Continue Lexapro   Mobility: Encourage ambulation  Goals of care   Code Status: Full Code     DVT prophylaxis:  enoxaparin (LOVENOX) injection 30 mg Start: 08/28/23 1400 SCDs Start: 08/21/23 2156   Antimicrobials: IV Zosyn Fluid: NS at 100 mL/h Consultants: General Surgery Family Communication: None at bedside  Status: Inpatient Level of care:  Med-Surg   Patient is from: Home Needs to continue in-hospital care: Postop ileus Anticipated d/c to: Outpatient PT   Diet:  Diet Order              Diet clear liquid Fluid consistency: Thin  Diet effective now                   Scheduled Meds:  acetaminophen  1,000 mg Oral Q6H   atorvastatin  20 mg Oral q AM   docusate sodium  100 mg Oral Daily   enoxaparin (LOVENOX) injection  30 mg Subcutaneous Q24H   escitalopram  20 mg Oral q AM   levothyroxine  100 mcg Oral QAC breakfast   pantoprazole  40 mg Oral Daily    PRN meds: methocarbamol, morphine injection, oxyCODONE, polyethylene glycol   Infusions:   sodium chloride 100 mL/hr at 08/26/23 2326   piperacillin-tazobactam (ZOSYN)  IV 3.375 g (08/28/23 0220)    Antimicrobials: Anti-infectives (From admission, onward)    Start     Dose/Rate Route Frequency Ordered Stop   08/22/23 0200  piperacillin-tazobactam (ZOSYN) IVPB 3.375 g        3.375 g 12.5 mL/hr over 240 Minutes Intravenous Every 8 hours 08/21/23 2221 09/01/23 2359   08/21/23 1900  piperacillin-tazobactam (ZOSYN) IVPB 3.375 g        3.375 g 100 mL/hr over 30 Minutes Intravenous  Once 08/21/23 1853 08/21/23 2103       Objective: Vitals:   08/28/23 0805 08/28/23 1715  BP: 126/69 137/66  Pulse: 62 (!) 58  Resp: 18 19  Temp: 98 F (36.7 C) 97.6 F (36.4 C)  SpO2: 96% 96%    Intake/Output Summary (Last 24 hours) at 08/28/2023 1734 Last data filed at 08/28/2023 1526 Gross per 24 hour  Intake --  Output 920 ml  Net -920 ml   [employer]  08/21/23 1850  Weight: 49 kg   Weight change:  Body mass index is 20.41 kg/m.   Physical Exam: General exam: Pleasant, not in distress Skin: No rashes, lesions or ulcers. HEENT: Atraumatic, normocephalic, no obvious bleeding Lungs: Clear to auscultation bilaterally,  CVS: S1, S2, no murmur,   GI/Abd: Soft, appropriate postop tenderness, nondistended, bowel sound sluggish CNS: Alert, awake, oriented x 3 Psychiatry: Mood appropriate,  Extremities: No pedal edema, no calf tenderness,   Data Review: I have personally reviewed the laboratory data and  studies available.  F/u labs ordered Unresulted Labs (From admission, onward)     Start     Ordered   08/29/23 0500  Basic metabolic panel  Tomorrow morning,   R        08/28/23 1734   08/29/23 0500  CBC with Differential/Platelet  Tomorrow morning,   R        08/28/23 1734            Total time spent in review of labs and imaging, patient evaluation, formulation of plan, documentation and communication with family: 45 minutes  Signed, Lorin Glass, MD [facility] 08/28/2023

## 2023-08-28 NOTE — Progress Notes (Signed)
Physical Therapy Treatment Patient Details Name: [name] MRN: [id number] DOB: [birth year] Today's Date: 08/28/2023   History of Present Illness The pt is a 73 yo female presenting 1/15 with abdominal pain, found to have perforated appendix with possible developing abscess, admitted for management. Now s/p laparoscopic appendectomy and debridement of intraabdominal abscess. PMH includes: HLD, hypothyroidism, muscular dystrophy, and oculopharyngeal muscular dystrophy.    PT Comments  Pt seen for PT tx with pt agreeable, husband present for session. Pt is eager & motivated to participate. Pt ambulates in hallway around unit with RW & supervision. Pt does attempt to ambulate a few feet in room without AD but pt reaching for furniture for support. Pt attempts to void but unsuccessful. Pt is making great progress with mobility, reports she has a RW at home she can use at d/c.    If plan is discharge home, recommend the following: A little help with walking and/or transfers;A little help with bathing/dressing/bathroom;Assist for transportation;Help with stairs or ramp for entrance   Can travel by private vehicle        Equipment Recommendations  None recommended by PT    Recommendations for Other Services       Precautions / Restrictions Precautions Precautions: Fall Precaution Comments: hx of 2 falls in last month, R JP drain Restrictions Weight Bearing Restrictions Per Provider Order: No     Mobility  Bed Mobility               General bed mobility comments: not tested, pt received & left sitting in recliner    Transfers Overall transfer level: Needs assistance Equipment used: Rolling walker (2 wheels) Transfers: Sit to/from Stand Sit to Stand: Supervision           General transfer comment: STS from recliner, elevated seat in toilet    Ambulation/Gait Ambulation/Gait assistance: Supervision Gait Distance (Feet):  (>350 ft) Assistive device: Rolling walker  (2 wheels) Gait Pattern/deviations: Decreased stride length, Step-through pattern Gait velocity: slightly decreased     General Gait Details: decreased speed when turning   Stairs             Wheelchair Mobility     Tilt Bed    Modified Rankin (Stroke Patients Only)       Balance Overall balance assessment: Needs assistance Sitting-balance support: No upper extremity supported, Feet supported Sitting balance-Leahy Scale: Good     Standing balance support: Bilateral upper extremity supported, During functional activity, No upper extremity supported Standing balance-Leahy Scale: Fair Standing balance comment: CGA standing balance for hand hygiene at sink; pt does attempt to ambulate ~8 ft in room without AD & requires CGA for balance & pt reaching for bed/furniture for support                            Cognition Arousal: Alert Behavior During Therapy: [facility] for tasks assessed/performed Overall Cognitive Status: Within Functional Limits for tasks assessed                                 General Comments: very pleasant, motivated to participate. At end of session pt reports feeling "high" and "weird" - nurse made aware.        Exercises      General Comments General comments (skin integrity, edema, etc.): Pt attempted to void, notes she has the urge but unable to. BP at end  of session 131/70 mmHg MAP 86, HR 61 bpm.      Pertinent Vitals/Pain Pain Assessment Pain Assessment: Faces Faces Pain Scale: Hurts a little bit Pain Location: abdomen/surgical site Pain Descriptors / Indicators: Discomfort, Sore Pain Intervention(s): Monitored during session    Home Living                          Prior Function            PT Goals (current goals can now be found in the care plan section) Acute Rehab PT Goals Patient Stated Goal: return home PT Goal Formulation: With patient Time For Goal Achievement: 09/07/23 Potential to  Achieve Goals: Good Additional Goals Additional Goal #1: Pt will score <19/24 on DGI to indicate low risk of falls. Progress towards PT goals: Progressing toward goals    Frequency    Min 1X/week      PT Plan      Co-evaluation              AM-PAC PT "6 Clicks" Mobility   Outcome Measure  Help needed turning from your back to your side while in a flat bed without using bedrails?: None Help needed moving from lying on your back to sitting on the side of a flat bed without using bedrails?: A Little Help needed moving to and from a bed to a chair (including a wheelchair)?: A Little Help needed standing up from a chair using your arms (e.g., wheelchair or bedside chair)?: A Little Help needed to walk in hospital room?: A Little Help needed climbing 3-5 steps with a railing? : A Little 6 Click Score: 19    End of Session   Activity Tolerance: Patient tolerated treatment well Patient left: in chair;with call bell/phone within reach;with family/visitor present Nurse Communication: Mobility status (pt c/o feeling "high", "weird", feels need to void but unable, and drainage on abdomen from JP drain tube) PT Visit Diagnosis: Muscle weakness (generalized) (M62.81);Unsteadiness on feet (R26.81)     Time: 8657-8469 PT Time Calculation (min) (ACUTE ONLY): 22 min  Charges:    $Therapeutic Activity: 8-22 mins PT General Charges $$ ACUTE PT VISIT: 1 Visit                     Aleda Grana, PT, DPT 08/28/23, 11:54 AM   Sandi Mariscal 08/28/2023, 11:53 AM

## 2023-08-28 NOTE — Progress Notes (Addendum)
   Progress Note  1 Day Post-Op  Subjective: Overall feels well for post op.  In chair and just ambulated with OT.  Some belching.  No flatus.  No nausea.  Getting ready to eat some liquids.  Objective: Vital signs in last 24 hours: Temp:  [97.6 F (36.4 C)-98.4 F (36.9 C)] 98 F (36.7 C) (01/22 0805) Pulse Rate:  [56-62] 62 (01/22 0805) Resp:  [14-20] 18 (01/22 0805) BP: (126-154)/(57-81) 126/69 (01/22 0805) SpO2:  [92 %-98 %] 96 % (01/22 0805) Last BM Date : 08/24/23  Intake/Output from previous day: 01/21 0701 - 01/22 0700 In: 500 [I.V.:500] Out: 1165 [Urine:870; Drains:245; Blood:50] Intake/Output this shift: No intake/output data recorded.  PE: General: pleasant, NAD Abd: soft, appropriately tender, ND, incisions c/d/I, JP drain with serosang output (245cc documented)    Lab Results:  Recent Labs    08/26/23 0351 08/28/23 0427  WBC 11.8* 18.3*  HGB 11.3* 13.7  HCT 33.9* 41.0  PLT 413* 557*   BMET Recent Labs    08/26/23 0351 08/28/23 0427  NA 136 135  K 3.6 3.6  CL 99 100  CO2 24 22  GLUCOSE 213* 94  BUN 12 9  CREATININE 0.68 0.61  CALCIUM 8.8* 8.6*    PT/INR No results for input(s): "LABPROT", "INR" in the last 72 hours. CMP     Component Value Date/Time   NA 135 08/28/2023 0427   K 3.6 08/28/2023 0427   CL 100 08/28/2023 0427   CO2 22 08/28/2023 0427   GLUCOSE 94 08/28/2023 0427   BUN 9 08/28/2023 0427   CREATININE 0.61 08/28/2023 0427   CALCIUM 8.6 (L) 08/28/2023 0427   PROT 6.7 08/28/2023 0427   ALBUMIN 2.5 (L) 08/28/2023 0427   AST 22 08/28/2023 0427   ALT 23 08/28/2023 0427   ALKPHOS 66 08/28/2023 0427   BILITOT 0.5 08/28/2023 0427   GFRNONAA >60 08/28/2023 0427   GFRAA  01/30/2009 0355    >60        The eGFR has been calculated using the MDRD equation. This calculation has not been validated in all clinical situations. eGFR's persistently <60 mL/min signify possible Chronic Kidney Disease.   Lipase     Component  Value Date/Time   LIPASE 32 08/21/2023 1935       Studies/Results: No results found.    Anti-infectives: Anti-infectives (From admission, onward)    Start     Dose/Rate Route Frequency Ordered Stop   08/22/23 0200  piperacillin-tazobactam (ZOSYN) IVPB 3.375 g        3.375 g 12.5 mL/hr over 240 Minutes Intravenous Every 8 hours 08/21/23 2221     08/21/23 1900  piperacillin-tazobactam (ZOSYN) IVPB 3.375 g        3.375 g 100 mL/hr over 30 Minutes Intravenous  Once 08/21/23 1853 08/21/23 2103        Assessment/Plan POD 1, s/p lap appy Dr. Andrey Campanile, 1/22 for Perforated appendicitis with abscess - doing well today with multi-modal pain control -mobilizing already -foley out, hasn't voided yet -cont CLD today and monitor for post op ileus -cont JP Drain  FEN: CLD/IVF ID: [email or web address], 4-5 days post op VTE: Lovenox  Per TRH: Oculopharyngeal muscular dystrophy GERD HLD hypothyroidism    LOS: 7 days   Letha Cape, [facility] Surgery 08/28/2023, 10:25 AM Please see Amion for pager number during day hours 7:00am-4:30pm

## 2023-08-29 DIAGNOSIS — K3532 Acute appendicitis with perforation and localized peritonitis, without abscess: Secondary | ICD-10-CM | POA: Diagnosis not present

## 2023-08-29 LAB — CBC WITH DIFFERENTIAL/PLATELET
Abs Immature Granulocytes: 0.17 10*3/uL — ABNORMAL HIGH (ref 0.00–0.07)
Basophils Absolute: 0.1 10*3/uL (ref 0.0–0.1)
Basophils Relative: 0 %
Eosinophils Absolute: 0.3 10*3/uL (ref 0.0–0.5)
Eosinophils Relative: 1 %
HCT: 35.8 % — ABNORMAL LOW (ref 36.0–46.0)
Hemoglobin: 11.8 g/dL — ABNORMAL LOW (ref 12.0–15.0)
Immature Granulocytes: 1 %
Lymphocytes Relative: 8 %
Lymphs Abs: 1.6 10*3/uL (ref 0.7–4.0)
MCH: 29.7 pg (ref 26.0–34.0)
MCHC: 33 g/dL (ref 30.0–36.0)
MCV: 90.2 fL (ref 80.0–100.0)
Monocytes Absolute: 0.6 10*3/uL (ref 0.1–1.0)
Monocytes Relative: 3 %
Neutro Abs: 18.2 10*3/uL — ABNORMAL HIGH (ref 1.7–7.7)
Neutrophils Relative %: 87 %
Platelets: 562 10*3/uL — ABNORMAL HIGH (ref 150–400)
RBC: 3.97 MIL/uL (ref 3.87–5.11)
RDW: 13.3 % (ref 11.5–15.5)
WBC: 20.8 10*3/uL — ABNORMAL HIGH (ref 4.0–10.5)
nRBC: 0 % (ref 0.0–0.2)

## 2023-08-29 LAB — BASIC METABOLIC PANEL
Anion gap: 10 (ref 5–15)
BUN: 16 mg/dL (ref 8–23)
CO2: 24 mmol/L (ref 22–32)
Calcium: 8 mg/dL — ABNORMAL LOW (ref 8.9–10.3)
Chloride: 104 mmol/L (ref 98–111)
Creatinine, Ser: 0.63 mg/dL (ref 0.44–1.00)
GFR, Estimated: >60 mL/min (ref >60)
Glucose, Bld: 92 mg/dL (ref 70–99)
Potassium: 3.4 mmol/L — ABNORMAL LOW (ref 3.5–5.1)
Sodium: 138 mmol/L (ref 135–145)

## 2023-08-29 NOTE — TOC Progression Note (Signed)
Transition of Care [facility]) - Progression Note    Patient Details  Name: [name] MRN: [id number] Date of Birth: 1951-07-05  Transition [facility] CM/SW Contact  Harriet Masson, RN Phone Number: 08/29/2023, 10:32 AM  Clinical Narrative:    Spoke to patient's husband at bedside regarding transition needs.  Declines OP rehab and Home health stating Patient goes to gym a few times a week and plans to start yoga.  PCP confirmed.    Expected Discharge Plan: OP Rehab Barriers to Discharge: Continued Medical Work up  Expected Discharge Plan and Services In-house Referral: Clinical Social Work     Living arrangements for the past 2 months: Single Family Home                                       Social Determinants of Health (SDOH) Interventions SDOH Screenings   Food Insecurity: No Food Insecurity (08/22/2023)  Housing: Low Risk  (08/22/2023)  Transportation Needs: No Transportation Needs (08/22/2023)  Utilities: Not At Risk (08/22/2023)  Depression (PHQ2-9): Low Risk  (04/05/2021)  Social Connections: Socially Isolated (08/22/2023)  Tobacco Use: Medium Risk (08/27/2023)    Readmission Risk Interventions     No data to display

## 2023-08-29 NOTE — Progress Notes (Signed)
Mobility Specialist Progress Note:   08/29/23 1240  Mobility  Activity Ambulated with assistance in hallway  Level of Assistance Standby assist, set-up cues, supervision of patient - no hands on  Assistive Device Front wheel walker  Distance Ambulated (ft) 400 ft  Activity Response Tolerated well  Mobility Referral Yes  Mobility visit 1 Mobility  Mobility Specialist Start Time (ACUTE ONLY) 1224  Mobility Specialist Stop Time (ACUTE ONLY) 1238  Mobility Specialist Time Calculation (min) (ACUTE ONLY) 14 min   Pt received in bed, agreeable to mobility. Pt c/o fatigue, otherwise asx throughout. No hands on assistance needed during ambulation. Pt left in bed with call bell in reach and all needs met.   Leory Plowman  Mobility Specialist Please contact via [employer] office at [phone]

## 2023-08-29 NOTE — Discharge Instructions (Signed)

## 2023-08-29 NOTE — Progress Notes (Signed)
PROGRESS NOTE  [name]  DOB: [birth year]  PCP: Assunta Found, MD WNU:[id number]  DOA: 08/21/2023  LOS: 8 days  Hospital Day: 9  Brief narrative: [name] is a 73 y.o. female with PMH significant for HLD, hypothyroidism, GERD, muscular dystrophy. Patient was having abdominal pain for few days prior to presentation.  She was seen by her PCP as an outpatient who obtained a CT abdomen that suggested possible perforated appendix with abscess formation and hence patient was sent to the ED on 1/15. Initial labs showed leukocytosis General surgery was consulted Patient was admitted to [facility] for conservative management with IV antibiotics Initially improved but started to worsen again. 1/21, patient underwent laparoscopic appendectomy and drainage of intra-abdominal abscess.  Subjective: Patient was seen and examined this morning. Lying on bed.  Not in distress.  States she passed flatus and had a bowel movement yesterday. Ambulating with PT Diet per surgery.  Tolerating clear liquid diet Husband at the side Labs from this morning with WBC count up  Assessment and plan: Perforated appendix S/p laparoscopic appendectomy and drainage of abscess -1/21 Dr. Andrey Campanile General Surgery following  Postop ileus seems to be resolving.  Passed flatus.  Had BM yesterday.  Tolerated clear liquid diet.  General surgery advance to full liquid diet today Continue IV antibiotics.  WBC count up today.  Defer to general surgery Recent Labs  Lab 08/24/23 0804 08/25/23 0420 08/26/23 0351 08/28/23 0427 08/29/23 0515  WBC 11.1* 12.8* 11.8* 18.3* 20.8*     Hypothyroidism Continue Synthroid   Hyperlipidemia Aortic atherosclerosis Continue Lipitor   GERD Continue Protonix   Osteoporosis Patient is on Fosamax, calcium and vitamin D supplementation as an outpatient.   Depression Continue Lexapro   Mobility: Encourage ambulation  Goals of care   Code Status: Full Code    DVT  prophylaxis:  enoxaparin (LOVENOX) injection 30 mg Start: 08/28/23 1400 SCDs Start: 08/21/23 2156   Antimicrobials: IV Zosyn Fluid: NS at 100 mL/h Consultants: General Surgery Family Communication: None at bedside  Status: Inpatient Level of care:  Med-Surg   Patient is from: Home Needs to continue in-hospital care: Postop ileus Anticipated d/c to: Outpatient PT   Diet:  Diet Order             Diet full liquid Fluid consistency: Thin  Diet effective now                   Scheduled Meds:  acetaminophen  1,000 mg Oral Q6H   atorvastatin  20 mg Oral q AM   docusate sodium  100 mg Oral Daily   enoxaparin (LOVENOX) injection  30 mg Subcutaneous Q24H   escitalopram  20 mg Oral q AM   levothyroxine  100 mcg Oral QAC breakfast   pantoprazole  40 mg Oral Daily    PRN meds: methocarbamol, morphine injection, oxyCODONE, polyethylene glycol, simethicone   Infusions:   sodium chloride 100 mL/hr at 08/29/23 0621   piperacillin-tazobactam (ZOSYN)  IV 3.375 g (08/29/23 0347)    Antimicrobials: Anti-infectives (From admission, onward)    Start     Dose/Rate Route Frequency Ordered Stop   08/22/23 0200  piperacillin-tazobactam (ZOSYN) IVPB 3.375 g        3.375 g 12.5 mL/hr over 240 Minutes Intravenous Every 8 hours 08/21/23 2221 09/01/23 2359   08/21/23 1900  piperacillin-tazobactam (ZOSYN) IVPB 3.375 g        3.375 g 100 mL/hr over 30 Minutes Intravenous  Once 08/21/23 1853 08/21/23  2103       Objective: Vitals:   08/29/23 0430 08/29/23 0734  BP: 132/76 121/67  Pulse: 61 (!) 57  Resp: 18   Temp: 98.6 F (37 C) 97.9 F (36.6 C)  SpO2: 99% 99%    Intake/Output Summary (Last 24 hours) at 08/29/2023 1307 Last data filed at 08/28/2023 2026 Gross per 24 hour  Intake --  Output 45 ml  Net -45 ml   [employer]   08/21/23 1850  Weight: 49 kg   Weight change:  Body mass index is 20.41 kg/m.   Physical Exam: General exam: Pleasant, not in distress Skin:  No rashes, lesions or ulcers. HEENT: Atraumatic, normocephalic, no obvious bleeding Lungs: Clear to auscultation bilaterally,  CVS: S1, S2, no murmur,   GI/Abd: Soft, appropriate postop tenderness, nondistended, bowel sound present but sluggish CNS: Alert, awake, oriented x 3 Psychiatry: Mood appropriate,  Extremities: No pedal edema, no calf tenderness,   Data Review: I have personally reviewed the laboratory data and studies available.  F/u labs ordered Unresulted Labs (From admission, onward)     Start     Ordered   08/30/23 0500  CBC  Tomorrow morning,   R        08/29/23 1005            Total time spent in review of labs and imaging, patient evaluation, formulation of plan, documentation and communication with family: 45 minutes  Signed, Lorin Glass, MD [facility] 08/29/2023

## 2023-08-29 NOTE — Plan of Care (Signed)
  Problem: Education: Goal: Knowledge of General Education information will improve Description: Including pain rating scale, medication(s)/side effects and non-pharmacologic comfort measures Outcome: Progressing   Problem: Health Behavior/Discharge Planning: Goal: Ability to manage health-related needs will improve Outcome: Progressing   Problem: Clinical Measurements: Goal: Ability to maintain clinical measurements within normal limits will improve Outcome: Progressing Goal: Will remain free from infection Outcome: Progressing Goal: Diagnostic test results will improve Outcome: Progressing   Problem: Activity: Goal: Risk for activity intolerance will decrease Outcome: Progressing   Problem: Elimination: Goal: Will not experience complications related to bowel motility Outcome: Adequate for Discharge Goal: Will not experience complications related to urinary retention Outcome: Adequate for Discharge   Problem: Pain Managment: Goal: General experience of comfort will improve and/or be controlled Outcome: Adequate for Discharge

## 2023-08-29 NOTE — Progress Notes (Signed)
   Progress Note  2 Days Post-Op  Subjective: Pain controlled with oxy.  Ambulating with therapies.  Tolerating CLD.  + flatus and BMs.  No nausea.  Objective: Vital signs in last 24 hours: Temp:  [97.6 F (36.4 C)-98.9 F (37.2 C)] 97.9 F (36.6 C) (01/23 0734) Pulse Rate:  [57-62] 57 (01/23 0734) Resp:  [18-19] 18 (01/23 0430) BP: (121-149)/(66-76) 121/67 (01/23 0734) SpO2:  [96 %-99 %] 99 % (01/23 0734) Last BM Date : 08/28/23  Intake/Output from previous day: 01/22 0701 - 01/23 0700 In: -  Out: 45 [Drains:45] Intake/Output this shift: No intake/output data recorded.  PE: General: pleasant, NAD Abd: soft, appropriately tender, ND, incisions c/d/I, JP drain with serosang output (45cc documented)    Lab Results:  Recent Labs    08/28/23 0427 08/29/23 0515  WBC 18.3* 20.8*  HGB 13.7 11.8*  HCT 41.0 35.8*  PLT 557* 562*   BMET Recent Labs    08/28/23 0427 08/29/23 0515  NA 135 138  K 3.6 3.4*  CL 100 104  CO2 22 24  GLUCOSE 94 92  BUN 9 16  CREATININE 0.61 0.63  CALCIUM 8.6* 8.0*    PT/INR No results for input(s): "LABPROT", "INR" in the last 72 hours. CMP     Component Value Date/Time   NA 138 08/29/2023 0515   K 3.4 (L) 08/29/2023 0515   CL 104 08/29/2023 0515   CO2 24 08/29/2023 0515   GLUCOSE 92 08/29/2023 0515   BUN 16 08/29/2023 0515   CREATININE 0.63 08/29/2023 0515   CALCIUM 8.0 (L) 08/29/2023 0515   PROT 6.7 08/28/2023 0427   ALBUMIN 2.5 (L) 08/28/2023 0427   AST 22 08/28/2023 0427   ALT 23 08/28/2023 0427   ALKPHOS 66 08/28/2023 0427   BILITOT 0.5 08/28/2023 0427   GFRNONAA >60 08/29/2023 0515   GFRAA  01/30/2009 0355    >60        The eGFR has been calculated using the MDRD equation. This calculation has not been validated in all clinical situations. eGFR's persistently <60 mL/min signify possible Chronic Kidney Disease.   Lipase     Component Value Date/Time   LIPASE 32 08/21/2023 1935        Studies/Results: No results found.    Anti-infectives: Anti-infectives (From admission, onward)    Start     Dose/Rate Route Frequency Ordered Stop   08/22/23 0200  piperacillin-tazobactam (ZOSYN) IVPB 3.375 g        3.375 g 12.5 mL/hr over 240 Minutes Intravenous Every 8 hours 08/21/23 2221 09/01/23 2359   08/21/23 1900  piperacillin-tazobactam (ZOSYN) IVPB 3.375 g        3.375 g 100 mL/hr over 30 Minutes Intravenous  Once 08/21/23 1853 08/21/23 2103        Assessment/Plan POD 2, s/p lap appy Dr. Andrey Campanile, 1/22 for Perforated appendicitis with abscess - doing well today with multi-modal pain control -mobilizing  -adv to FLD diet -cont JP Drain -WBC 20K, but suspect this is still up from post op inflammation as she has no fever, drain is SS, and looks well.  Anticipate this will start downtrending soon.  FEN: FLD/IVF ID: [email or web address], 4-5 days post op VTE: Lovenox  Per TRH: Oculopharyngeal muscular dystrophy GERD HLD hypothyroidism    LOS: 8 days   Letha Cape, [facility] 08/29/2023, 10:03 AM Please see Amion for pager number during day hours 7:00am-4:30pm

## 2023-08-30 LAB — CBC
HCT: 36.1 % (ref 36.0–46.0)
Hemoglobin: 11.9 g/dL — ABNORMAL LOW (ref 12.0–15.0)
MCH: 29.7 pg (ref 26.0–34.0)
MCHC: 33 g/dL (ref 30.0–36.0)
MCV: 90 fL (ref 80.0–100.0)
Platelets: 660 10*3/uL — ABNORMAL HIGH (ref 150–400)
RBC: 4.01 MIL/uL (ref 3.87–5.11)
RDW: 13.6 % (ref 11.5–15.5)
WBC: 15.4 10*3/uL — ABNORMAL HIGH (ref 4.0–10.5)
nRBC: 0 % (ref 0.0–0.2)

## 2023-08-30 MED ORDER — ACETAMINOPHEN 500 MG PO TABS: 1000.0000 mg | ORAL_TABLET | Freq: Four times a day (QID) | ORAL | Status: AC | PRN

## 2023-08-30 MED ORDER — AMOXICILLIN-POT CLAVULANATE 875-125 MG PO TABS: 1.0000 | ORAL_TABLET | Freq: Two times a day (BID) | ORAL | 0 refills | Status: AC

## 2023-08-30 MED ORDER — POLYETHYLENE GLYCOL 3350 17 G PO PACK
17.0000 g | PACK | Freq: Every day | ORAL | Status: DC
  Administered 2023-08-30: 17 g via ORAL
  Filled 2023-08-30: qty 1

## 2023-08-30 MED ORDER — OXYCODONE HCL 5 MG PO TABS: 2.5000 mg | ORAL_TABLET | ORAL | 0 refills | Status: DC | PRN

## 2023-08-30 MED ORDER — AMOXICILLIN-POT CLAVULANATE 875-125 MG PO TABS: 1.0000 | ORAL_TABLET | Freq: Two times a day (BID) | ORAL | Status: DC

## 2023-08-30 MED ORDER — METHOCARBAMOL 500 MG PO TABS: 500.0000 mg | ORAL_TABLET | Freq: Three times a day (TID) | ORAL | 0 refills | Status: DC | PRN

## 2023-08-30 NOTE — Discharge Summary (Signed)
Patient ID: [name] [id number] Dec 27, 1950 73 y.o.  Admit date: 08/21/2023 Discharge date: 08/30/2023  Admitting Diagnosis: Perforated appendicitis with small abscess  Discharge Diagnosis Patient Active Problem List   Diagnosis Date Noted   Perforated appendix 08/21/2023   HLD (hyperlipidemia) 08/21/2023   Hav (hallux abducto valgus), right 07/15/2019   Memory impairment of gradual onset 07/08/2015   Pharyngeal dysphagia 11/17/2014   GERD (gastroesophageal reflux disease) 08/30/2014   Hypothyroidism 08/30/2014   Encounter for screening colonoscopy 06/24/2012   Dysphagia 06/24/2012   Other specified muscular dystrophies (HCC) 09/17/2011  S/p lap appy for perforated appendicitis  Consultants General surgery  Reason for Admission:  [name] is a 73 y.o. female with history of hyperlipidemia and hypothyroidism and cognitive issues has been experiencing abdominal pain for the last 2 days.  Denies any nausea vomiting or diarrhea.  Pain is mostly in the lower quadrants across the abdomen.  Patient had followed up with her primary care physician who did an outpatient CT scan which shows possible perforated appendix with abscess formation.  Patient was referred to the ER.   ED Course: In the ER patient's labs show leukocytosis.  On exam patient has significant abdominal diffuse tenderness.  General surgery was consulted patient was started on antibiotics and at this time planning conservative measures and hospitalist requested admission.  Procedures Lap appy with JP drain placement, Dr. Andrey Campanile 08/27/2023  Hospital Course:  The patient was admitted and started on IV zosyn.  She initially had some bowel rest and she was clinically improving.  Unfortunately, she plateaued and a repeat CT scan was ordered around HD 5.  This revealed persistent perforated appendicitis with a large abscess.  This was not amendable to drainage still.  She then underwent a lap appy with JP drain  placement as outlined above.  She tolerated this well.  Her diet was able to be advanced as tolerated.  She remained on 5 days of post op abx (home with 2 additional days of PO).  Her JP drain remained serous and this was removed prior to discharge.  She was stable for DC home on POD 3 with appropriate follow up made.  Physical Exam: Gen: NAD Abd: soft, appropriately tender, incisions c/d/I, JP with serous fluid, ND  Allergies as of 08/30/2023       Reactions   Advil [ibuprofen] Anaphylaxis, Swelling   Throat, lip swelling        Medication List     TAKE these medications    acetaminophen 500 MG tablet Commonly known as: TYLENOL Take 2 tablets (1,000 mg total) by mouth every 6 (six) hours as needed.   alendronate 70 MG tablet Commonly known as: FOSAMAX Take 70 mg by mouth once a week. On Wednesday   aluminum-magnesium hydroxide 200-200 MG/5ML suspension Take 5 mLs by mouth every 6 (six) hours as needed for indigestion.   amoxicillin-clavulanate 875-125 MG tablet Commonly known as: AUGMENTIN Take 1 tablet by mouth every 12 (twelve) hours for 2 days.   atorvastatin 20 MG tablet Commonly known as: LIPITOR Take 20 mg by mouth in the morning.   bismuth subsalicylate 262 MG/15ML suspension Commonly known as: PEPTO BISMOL Take 30 mLs by mouth every 6 (six) hours as needed.   calcium carbonate 500 MG chewable tablet Commonly known as: TUMS - dosed in mg elemental calcium Chew 2 tablets by mouth daily as needed for indigestion or heartburn.   CALCIUM PO Take 2 tablets by mouth in the morning.  cholecalciferol 25 MCG (1000 UNIT) tablet Commonly known as: VITAMIN D3 Take 2,000 Units by mouth in the morning.   escitalopram 20 MG tablet Commonly known as: LEXAPRO Take 20 mg by mouth in the morning.   esomeprazole 20 MG capsule Commonly known as: NEXIUM Take 20 mg by mouth in the morning.   fluticasone 50 MCG/ACT nasal spray Commonly known as: FLONASE Place 1 spray  into both nostrils daily as needed for allergies or rhinitis.   levothyroxine 100 MCG tablet Commonly known as: SYNTHROID Take 100 mcg by mouth daily before breakfast.   methocarbamol 500 MG tablet Commonly known as: ROBAXIN Take 1 tablet (500 mg total) by mouth every 8 (eight) hours as needed for muscle spasms.   multivitamin tablet Take 2 tablets by mouth in the morning.   oxyCODONE 5 MG immediate release tablet Commonly known as: Oxy IR/ROXICODONE Take 0.5-1 tablets (2.5-5 mg total) by mouth every 4 (four) hours as needed for moderate pain (pain score 4-6) or severe pain (pain score 7-10) (2.5 mg for 4-6/10 pain. 5 mg for >6/10 pain).   polyethylene glycol 17 g packet Commonly known as: MIRALAX / GLYCOLAX Take 17 g by mouth daily as needed for mild constipation or moderate constipation.          Follow-up Information     Maczis, Hedda Slade, PA-C Follow up on 09/24/2023.   Specialty: General Surgery Why: 9:00am, Arrive 30 minutes prior to your appointment time, Please bring your insurance card and photo ID Contact information: [address] [place] [address] [facility] [place] [address] [phone]                 Signed: Barnetta Chapel, [facility] Surgery 08/30/2023, 12:11 PM Please see Amion for pager number during day hours 7:00am-4:30pm, 7-11:30am on Weekends

## 2023-08-30 NOTE — Progress Notes (Signed)
Discussed with general surgery. No active medical issues.. General surgery to take over as primary service TRH will sign off

## 2023-08-30 NOTE — Progress Notes (Signed)
Mobility Specialist Progress Note:   08/30/23 1245  Mobility  Activity Ambulated with assistance in hallway  Level of Assistance Standby assist, set-up cues, supervision of patient - no hands on  Assistive Device Front wheel walker  Distance Ambulated (ft) 700 ft  Activity Response Tolerated well  Mobility Referral Yes  Mobility visit 1 Mobility  Mobility Specialist Start Time (ACUTE ONLY) 1105  Mobility Specialist Stop Time (ACUTE ONLY) 1120  Mobility Specialist Time Calculation (min) (ACUTE ONLY) 15 min   Pt received in bed, eager to mobility. Pt was able to tolerate increased gait distance. No complaints stated during ambulation, asx throughout. Pt left in bed with call bell in reach and all needs met.   Leory Plowman  Mobility Specialist Please contact via [employer] office at [phone]

## 2023-08-30 NOTE — Progress Notes (Signed)
Occupational Therapy Treatment Patient Details Name: [name] MRN: [id number] DOB: 01-07-1951 Today's Date: 08/30/2023   History of present illness The pt is a 73 yo female presenting 1/15 with abdominal pain, found to have perforated appendix with possible developing abscess, admitted for management. Now s/p laparoscopic appendectomy and debridement of intraabdominal abscess. PMH includes: HLD, hypothyroidism, muscular dystrophy, and oculopharyngeal muscular dystrophy.   OT comments  Pt. Seen for skilled OT treatment.  Pt.s spouse also present during session.  Pt. Able to complete bed mobility in/out of bed with S.  LB dressing with S and cues for compensatory tech.  Toileting and grooming tasks with S/CGA.  Education provided on [employer] and safe transfer tech. With focus on hand placement prior to sitting down.  Pt. And spouse cont. To support that she prefers to manage post hospital strengthening with going to the gym vs. Any additional therapy services.  Report she will use RW vs. Cane she was using prior to admission for extra support and safety.  Cont. With acute OT POC.        If plan is discharge home, recommend the following:  A little help with walking and/or transfers;A little help with bathing/dressing/bathroom;Assistance with cooking/housework;Assist for transportation;Direct supervision/assist for financial management;Direct supervision/assist for medications management   Equipment Recommendations  None recommended by OT    Recommendations for Other Services      Precautions / Restrictions Precautions Precautions: Fall Precaution Comments: hx of 2 falls in last month, R JP drain       Mobility Bed Mobility Overal bed mobility: Needs Assistance Bed Mobility: Sit to Supine, Supine to Sit     Supine to sit: Supervision Sit to supine: Supervision   General bed mobility comments: pt. able to get in/out of bed and manage bles without assistance or use of  rails, hob flattened to simulate home env. also no rails, exits on L side    Transfers Overall transfer level: Needs assistance Equipment used: Rolling walker (2 wheels) Transfers: Sit to/from Stand, Bed to chair/wheelchair/BSC Sit to Stand: Supervision     Step pivot transfers: Contact guard assist     General transfer comment: cues for hand placement and safe tech. for sit/stand and stand/sit-feeling with BLEs first then reaching back.     Balance                                           ADL either performed or assessed with clinical judgement   ADL Overall ADL's : Needs assistance/impaired     Grooming: Contact guard assist;Standing;Wash/dry hands               Lower Body Dressing: Set up;Sitting/lateral leans Lower Body Dressing Details (indicate cue type and reason): donning socks. Encouraged figure 4 but pt continues to lean forward to feet with mild discomfort noted. Pt with notably decreased dexterity which is likely her baseline-greater ease with RLE vs. LLE, able to turn sideways in bed to reach RLE with more ease. educated on not bending forward as that is a fall/safety risk Toilet Transfer: Contact guard assist;Rolling walker (2 wheels)   Toileting- Clothing Manipulation and Hygiene: Sitting/lateral lean;Supervision/safety       Functional mobility during ADLs: Contact guard assist;Rolling walker (2 wheels) General ADL Comments: education/cues for RW safety.  how to stand inside of it when facing a counter/surface.    Extremity/Trunk  Assessment              Vision       Perception     Praxis      Cognition Arousal: Alert Behavior During Therapy: WFL for tasks assessed/performed Overall Cognitive Status: Within Functional Limits for tasks assessed                                 General Comments: motivated for participation        Exercises      Shoulder Instructions       General Comments       Pertinent Vitals/ Pain          Home Living                                          Prior Functioning/Environment              Frequency  Min 1X/week        Progress Toward Goals  OT Goals(current goals can now be found in the care plan section)  Progress towards OT goals: Progressing toward goals     Plan      Co-evaluation                 AM-PAC OT "6 Clicks" Daily Activity     Outcome Measure   Help from another person eating meals?: None Help from another person taking care of personal grooming?: A Little Help from another person toileting, which includes using toliet, bedpan, or urinal?: A Little Help from another person bathing (including washing, rinsing, drying)?: A Little Help from another person to put on and taking off regular upper body clothing?: A Little Help from another person to put on and taking off regular lower body clothing?: A Little 6 Click Score: 19    End of Session Equipment Utilized During Treatment: Rolling walker (2 wheels);Gait belt  OT Visit Diagnosis: Other abnormalities of gait and mobility (R26.89);Unsteadiness on feet (R26.81);Muscle weakness (generalized) (M62.81);Low vision, both eyes (H54.2);Pain   Activity Tolerance Patient tolerated treatment well   Patient Left in bed;with call bell/phone within reach;with bed alarm set   Nurse Communication Other (comment) (reported session details to rn)        Time: 1012-1030 OT Time Calculation (min): 18 min  Charges: OT General Charges $OT Visit: 1 Visit OT Treatments $Self Care/Home Management : 8-22 mins  Boneta Lucks, COTA/L Acute Rehabilitation [phone]   Alessandra Bevels Lorraine-COTA/L 08/30/2023, 11:13 AM

## 2023-08-30 NOTE — TOC Transition Note (Signed)
Transition of Care [facility]) - Discharge Note   Patient Details  Name: [name] MRN: [id number] Date of Birth: 03/10/1951  [facility] CM/SW Contact:  Harriet Masson, RN Phone Number: 08/30/2023, 12:41 PM   Clinical Narrative:    Patient stable for discharge.  Patient declines home health and OP rehab.  Family will transport home.    Final next level of care: Home/Self Care Barriers to Discharge: Barriers Resolved   Patient Goals and CMS Choice Patient states their goals for this hospitalization and ongoing recovery are:: return home          Discharge Placement             home          Discharge Plan and Services Additional resources added to the After Visit Summary for   In-house Referral: Clinical Social Work                                   Social Drivers of Health (SDOH) Interventions SDOH Screenings   Food Insecurity: No Food Insecurity (08/22/2023)  Housing: Low Risk  (08/22/2023)  Transportation Needs: No Transportation Needs (08/22/2023)  Utilities: Not At Risk (08/22/2023)  Depression (PHQ2-9): Low Risk  (04/05/2021)  Social Connections: Socially Isolated (08/22/2023)  Tobacco Use: Medium Risk (08/27/2023)     Readmission Risk Interventions    08/30/2023   12:41 PM 08/30/2023   12:40 PM  Readmission Risk Prevention Plan  Post Dischage Appt Not Complete Complete  Appt Comments specialist apt 09/24/23   Medication Screening  Complete  Transportation Screening  Complete

## 2023-09-02 ENCOUNTER — Ambulatory Visit: Payer: Self-pay | Admitting: General Surgery

## 2023-10-24 DIAGNOSIS — R1312 Dysphagia, oropharyngeal phase: Secondary | ICD-10-CM | POA: Diagnosis not present

## 2023-10-28 DIAGNOSIS — H04123 Dry eye syndrome of bilateral lacrimal glands: Secondary | ICD-10-CM | POA: Diagnosis not present

## 2024-01-20 ENCOUNTER — Other Ambulatory Visit (HOSPITAL_COMMUNITY): Payer: Self-pay | Admitting: Family Medicine

## 2024-01-20 DIAGNOSIS — Z1231 Encounter for screening mammogram for malignant neoplasm of breast: Secondary | ICD-10-CM

## 2024-02-05 ENCOUNTER — Ambulatory Visit (HOSPITAL_COMMUNITY)
Admission: RE | Admit: 2024-02-05 | Discharge: 2024-02-05 | Disposition: A | Discharge status: Home / Self Care | Source: Ambulatory Visit | Attending: Family Medicine | Admitting: Family Medicine

## 2024-02-05 DIAGNOSIS — Z1231 Encounter for screening mammogram for malignant neoplasm of breast: Secondary | ICD-10-CM | POA: Diagnosis not present

## 2024-02-09 ENCOUNTER — Other Ambulatory Visit: Payer: Self-pay

## 2024-02-09 ENCOUNTER — Emergency Department (HOSPITAL_COMMUNITY)

## 2024-02-09 ENCOUNTER — Encounter (HOSPITAL_COMMUNITY): Payer: Self-pay

## 2024-02-09 ENCOUNTER — Observation Stay (HOSPITAL_COMMUNITY)
Admission: EM | Admit: 2024-02-09 | Discharge: 2024-02-10 | Disposition: A | Discharge status: Home / Self Care | Attending: Internal Medicine | Admitting: Internal Medicine

## 2024-02-09 DIAGNOSIS — R2689 Other abnormalities of gait and mobility: Secondary | ICD-10-CM | POA: Insufficient documentation

## 2024-02-09 DIAGNOSIS — G9341 Metabolic encephalopathy: Principal | ICD-10-CM | POA: Diagnosis present

## 2024-02-09 DIAGNOSIS — E039 Hypothyroidism, unspecified: Secondary | ICD-10-CM | POA: Diagnosis present

## 2024-02-09 DIAGNOSIS — Z87891 Personal history of nicotine dependence: Secondary | ICD-10-CM | POA: Diagnosis not present

## 2024-02-09 DIAGNOSIS — R825 Elevated urine levels of drugs, medicaments and biological substances: Secondary | ICD-10-CM

## 2024-02-09 DIAGNOSIS — E785 Hyperlipidemia, unspecified: Secondary | ICD-10-CM | POA: Diagnosis not present

## 2024-02-09 DIAGNOSIS — R7401 Elevation of levels of liver transaminase levels: Secondary | ICD-10-CM | POA: Insufficient documentation

## 2024-02-09 DIAGNOSIS — G9340 Encephalopathy, unspecified: Secondary | ICD-10-CM | POA: Diagnosis not present

## 2024-02-09 DIAGNOSIS — R413 Other amnesia: Secondary | ICD-10-CM | POA: Diagnosis not present

## 2024-02-09 DIAGNOSIS — R9431 Abnormal electrocardiogram [ECG] [EKG]: Secondary | ICD-10-CM | POA: Diagnosis not present

## 2024-02-09 DIAGNOSIS — E7849 Other hyperlipidemia: Secondary | ICD-10-CM | POA: Diagnosis not present

## 2024-02-09 DIAGNOSIS — R0902 Hypoxemia: Secondary | ICD-10-CM | POA: Diagnosis not present

## 2024-02-09 DIAGNOSIS — K219 Gastro-esophageal reflux disease without esophagitis: Secondary | ICD-10-CM | POA: Diagnosis not present

## 2024-02-09 DIAGNOSIS — R410 Disorientation, unspecified: Secondary | ICD-10-CM | POA: Diagnosis not present

## 2024-02-09 DIAGNOSIS — G301 Alzheimer's disease with late onset: Secondary | ICD-10-CM | POA: Insufficient documentation

## 2024-02-09 DIAGNOSIS — I959 Hypotension, unspecified: Secondary | ICD-10-CM | POA: Diagnosis not present

## 2024-02-09 DIAGNOSIS — R4182 Altered mental status, unspecified: Secondary | ICD-10-CM | POA: Diagnosis not present

## 2024-02-09 LAB — CBC WITH DIFFERENTIAL/PLATELET
Abs Immature Granulocytes: 0.02 K/uL (ref 0.00–0.07)
Basophils Absolute: 0 K/uL (ref 0.0–0.1)
Basophils Relative: 1 %
Eosinophils Absolute: 0.3 K/uL (ref 0.0–0.5)
Eosinophils Relative: 4 %
HCT: 35 % — ABNORMAL LOW (ref 36.0–46.0)
Hemoglobin: 11.9 g/dL — ABNORMAL LOW (ref 12.0–15.0)
Immature Granulocytes: 0 %
Lymphocytes Relative: 37 %
Lymphs Abs: 2.6 K/uL (ref 0.7–4.0)
MCH: 30.8 pg (ref 26.0–34.0)
MCHC: 34 g/dL (ref 30.0–36.0)
MCV: 90.7 fL (ref 80.0–100.0)
Monocytes Absolute: 0.4 K/uL (ref 0.1–1.0)
Monocytes Relative: 5 %
Neutro Abs: 3.8 K/uL (ref 1.7–7.7)
Neutrophils Relative %: 53 %
Platelets: 269 K/uL (ref 150–400)
RBC: 3.86 MIL/uL — ABNORMAL LOW (ref 3.87–5.11)
RDW: 13.3 % (ref 11.5–15.5)
WBC: 7.1 K/uL (ref 4.0–10.5)
nRBC: 0 % (ref 0.0–0.2)

## 2024-02-09 LAB — COMPREHENSIVE METABOLIC PANEL WITH GFR
ALT: 48 U/L — ABNORMAL HIGH (ref 0–44)
AST: 58 U/L — ABNORMAL HIGH (ref 15–41)
Albumin: 3.6 g/dL (ref 3.5–5.0)
Alkaline Phosphatase: 58 U/L (ref 38–126)
Anion gap: 11 (ref 5–15)
BUN: 21 mg/dL (ref 8–23)
CO2: 25 mmol/L (ref 22–32)
Calcium: 9.5 mg/dL (ref 8.9–10.3)
Chloride: 102 mmol/L (ref 98–111)
Creatinine, Ser: 0.5 mg/dL (ref 0.44–1.00)
GFR, Estimated: >60 mL/min (ref >60)
Glucose, Bld: 96 mg/dL (ref 70–99)
Potassium: 3.8 mmol/L (ref 3.5–5.1)
Sodium: 138 mmol/L (ref 135–145)
Total Bilirubin: 0.4 mg/dL (ref 0.0–1.2)
Total Protein: 6.4 g/dL — ABNORMAL LOW (ref 6.5–8.1)

## 2024-02-09 LAB — AMMONIA: Ammonia: 25 umol/L (ref 9–35)

## 2024-02-09 LAB — TYPE AND SCREEN
ABO/RH(D): B POS
Antibody Screen: NEGATIVE

## 2024-02-09 LAB — URINALYSIS, W/ REFLEX TO CULTURE (INFECTION SUSPECTED)
Bacteria, UA: NONE SEEN
Bilirubin Urine: NEGATIVE
Glucose, UA: NEGATIVE mg/dL
Hgb urine dipstick: NEGATIVE
Ketones, ur: NEGATIVE mg/dL
Leukocytes,Ua: NEGATIVE
Nitrite: NEGATIVE
Protein, ur: 30 mg/dL — AB
Specific Gravity, Urine: 1.019 (ref 1.005–1.030)
pH: 7 (ref 5.0–8.0)

## 2024-02-09 LAB — RAPID URINE DRUG SCREEN, HOSP PERFORMED
Amphetamines: NOT DETECTED
Barbiturates: NOT DETECTED
Benzodiazepines: NOT DETECTED
Cocaine: NOT DETECTED
Opiates: NOT DETECTED
Tetrahydrocannabinol: POSITIVE — AB

## 2024-02-09 LAB — I-STAT CHEM 8, ED
BUN: 20 mg/dL (ref 8–23)
Calcium, Ion: 1.21 mmol/L (ref 1.15–1.40)
Chloride: 102 mmol/L (ref 98–111)
Creatinine, Ser: 0.6 mg/dL (ref 0.44–1.00)
Glucose, Bld: 95 mg/dL (ref 70–99)
HCT: 34 % — ABNORMAL LOW (ref 36.0–46.0)
Hemoglobin: 11.6 g/dL — ABNORMAL LOW (ref 12.0–15.0)
Potassium: 3.8 mmol/L (ref 3.5–5.1)
Sodium: 138 mmol/L (ref 135–145)
TCO2: 25 mmol/L (ref 22–32)

## 2024-02-09 LAB — ETHANOL: Alcohol, Ethyl (B): <15 mg/dL (ref <15)

## 2024-02-09 LAB — CBG MONITORING, ED: Glucose-Capillary: 94 mg/dL (ref 70–99)

## 2024-02-09 LAB — TSH: TSH: 6.433 u[IU]/mL — ABNORMAL HIGH (ref 0.350–4.500)

## 2024-02-09 MED ORDER — ESCITALOPRAM OXALATE 10 MG PO TABS
20.0000 mg | ORAL_TABLET | Freq: Every morning | ORAL | Status: DC
  Filled 2024-02-09: qty 2

## 2024-02-09 MED ORDER — ONDANSETRON HCL 4 MG/2ML IJ SOLN: 4.0000 mg | Freq: Four times a day (QID) | INTRAMUSCULAR | Status: DC | PRN

## 2024-02-09 MED ORDER — ENOXAPARIN SODIUM 40 MG/0.4ML IJ SOSY
40.0000 mg | PREFILLED_SYRINGE | INTRAMUSCULAR | Status: DC
  Administered 2024-02-10: 40 mg via SUBCUTANEOUS
  Filled 2024-02-09: qty 0.4

## 2024-02-09 MED ORDER — LEVOTHYROXINE SODIUM 100 MCG PO TABS
100.0000 ug | ORAL_TABLET | Freq: Every day | ORAL | Status: DC
  Administered 2024-02-10: 100 ug via ORAL
  Filled 2024-02-09: qty 1

## 2024-02-09 MED ORDER — FLUTICASONE PROPIONATE 50 MCG/ACT NA SUSP: 1.0000 | Freq: Every day | NASAL | Status: DC | PRN

## 2024-02-09 MED ORDER — ONDANSETRON HCL 4 MG PO TABS: 4.0000 mg | ORAL_TABLET | Freq: Four times a day (QID) | ORAL | Status: DC | PRN

## 2024-02-09 MED ORDER — ONDANSETRON HCL 4 MG/2ML IJ SOLN
4.0000 mg | Freq: Once | INTRAMUSCULAR | Status: AC
  Administered 2024-02-09: 4 mg via INTRAVENOUS
  Filled 2024-02-09: qty 2

## 2024-02-09 MED ORDER — PANTOPRAZOLE SODIUM 40 MG PO TBEC
40.0000 mg | DELAYED_RELEASE_TABLET | Freq: Every day | ORAL | Status: DC
  Administered 2024-02-10: 40 mg via ORAL
  Filled 2024-02-09: qty 1

## 2024-02-09 MED ORDER — ATORVASTATIN CALCIUM 20 MG PO TABS
20.0000 mg | ORAL_TABLET | Freq: Every morning | ORAL | Status: DC
  Administered 2024-02-10: 20 mg via ORAL
  Filled 2024-02-09: qty 1

## 2024-02-09 MED ORDER — ADULT MULTIVITAMIN W/MINERALS CH
2.0000 | ORAL_TABLET | Freq: Every morning | ORAL | Status: DC
  Administered 2024-02-10: 2 via ORAL
  Filled 2024-02-09: qty 2

## 2024-02-09 MED ORDER — POLYETHYLENE GLYCOL 3350 17 G PO PACK: 17.0000 g | PACK | Freq: Every day | ORAL | Status: DC | PRN

## 2024-02-09 MED ORDER — ACETAMINOPHEN 325 MG PO TABS: 650.0000 mg | ORAL_TABLET | Freq: Four times a day (QID) | ORAL | Status: DC | PRN

## 2024-02-09 MED ORDER — SODIUM CHLORIDE 0.9 % IV BOLUS
1000.0000 mL | Freq: Once | INTRAVENOUS | Status: AC
  Administered 2024-02-09: 1000 mL via INTRAVENOUS

## 2024-02-09 MED ORDER — THIAMINE MONONITRATE 100 MG PO TABS
100.0000 mg | ORAL_TABLET | Freq: Every day | ORAL | Status: DC
  Administered 2024-02-10: 100 mg via ORAL
  Filled 2024-02-09: qty 1

## 2024-02-09 MED ORDER — DEXTROSE-SODIUM CHLORIDE 5-0.9 % IV SOLN: INTRAVENOUS | Status: DC

## 2024-02-09 MED ORDER — ACETAMINOPHEN 650 MG RE SUPP: 650.0000 mg | Freq: Four times a day (QID) | RECTAL | Status: DC | PRN

## 2024-02-09 NOTE — Assessment & Plan Note (Addendum)
 Continue with levothyroxine .  Noted high TSH, will check free T4

## 2024-02-09 NOTE — Assessment & Plan Note (Signed)
 Continue with escitalopram .  Add multivitamins and thiamine 

## 2024-02-09 NOTE — ED Triage Notes (Signed)
 Pt arrives via [facility] from home for AMS. Pt reportedly was helping her husband mail packages approximately 2 hours ago and was seen well. She then began having increasing agitation, confusion. Pt reportedly had balance issues and one episode of emesis with EMS. On arrival pt shouting, making somewhat rambling statements, although able to answer some questions.

## 2024-02-09 NOTE — Assessment & Plan Note (Signed)
 Continue with atorvastatin

## 2024-02-09 NOTE — Assessment & Plan Note (Addendum)
 Possible intoxication with THC, acute dehydration.   Clinically improving with supportive medical therapy Plan to continue with IV fluids, and as needed antiemetics  Hold on oxycodone  and methocarbamol  for now.  Follow up with PT and OT If persistent symptoms may need brain MRI for further work up.   Elevated liver enzymes, continue with IV fluids and follow trend in am.

## 2024-02-09 NOTE — ED Provider Notes (Signed)
 [facility] Provider Note  CSN: [id number] Arrival date & time: 02/09/24 1727  Chief Complaint(s) Altered Mental Status  HPI [name] is a 73 y.o. female history of muscular dystrophy presenting with altered mental status.  Patient's husband reports around 2 hours ago patient seemed confused, said she was thirsty, then was not acting herself.  He reports that she has maybe had similar episodes when she was dehydrated or had a long day but when she sleeps then it improves.  He reports that she was out in the sun all day yesterday and he is concerned she might be dehydrated.  Otherwise was not complaining of any fevers, chills, abdominal pain, chest pain.  Had 1 episode of vomiting with paramedics. no falls or head injury.  No recent fevers or chills.  No other symptoms that are new.   Past Medical History Past Medical History:  Diagnosis Date   Broken arm    right   GERD (gastroesophageal reflux disease)    Hyperlipidemia    Hypothyroidism    Muscular dystrophy (HCC)    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HCC)    Patient Active Problem List   Diagnosis Date Noted   Acute metabolic encephalopathy 02/09/2024   Perforated appendix 08/21/2023   HLD (hyperlipidemia) 08/21/2023   Hav (hallux abducto valgus), right 07/15/2019   Memory impairment of gradual onset 07/08/2015   Pharyngeal dysphagia 11/17/2014   GERD (gastroesophageal reflux disease) 08/30/2014   Hypothyroidism 08/30/2014   Encounter for screening colonoscopy 06/24/2012   Dysphagia 06/24/2012   Other specified muscular dystrophies (HCC) 09/17/2011   Home Medication(s) Prior to Admission medications   Medication Sig Start Date End Date Taking? Authorizing Provider  acetaminophen  (TYLENOL ) 500 MG tablet Take 2 tablets (1,000 mg total) by mouth every 6 (six) hours as needed. 08/30/23   Tammy Sor, PA-C  alendronate (FOSAMAX) 70 MG tablet Take 70 mg by mouth once a week.  On Wednesday 12/22/20   [provider]  aluminum-magnesium hydroxide 200-200 MG/5ML suspension Take 5 mLs by mouth every 6 (six) hours as needed for indigestion.    [provider]  atorvastatin  (LIPITOR) 20 MG tablet Take 20 mg by mouth in the morning. 06/09/12   [provider]  bismuth subsalicylate (PEPTO BISMOL) 262 MG/15ML suspension Take 30 mLs by mouth every 6 (six) hours as needed.    [provider]  calcium  carbonate (TUMS - DOSED IN MG ELEMENTAL CALCIUM ) 500 MG chewable tablet Chew 2 tablets by mouth daily as needed for indigestion or heartburn.    [provider]  CALCIUM  PO Take 2 tablets by mouth in the morning.    [provider]  cholecalciferol (VITAMIN D3) 25 MCG (1000 UNIT) tablet Take 2,000 Units by mouth in the morning.    [provider]  escitalopram  (LEXAPRO ) 20 MG tablet Take 20 mg by mouth in the morning. 06/09/12   [provider]  esomeprazole (NEXIUM) 20 MG capsule Take 20 mg by mouth in the morning.    [provider]  fluticasone  (FLONASE ) 50 MCG/ACT nasal spray Place 1 spray into both nostrils daily as needed for allergies or rhinitis.    [provider]  levothyroxine  (SYNTHROID , LEVOTHROID) 100 MCG tablet Take 100 mcg by mouth daily before breakfast.    [provider]  methocarbamol  (ROBAXIN ) 500 MG tablet Take 1 tablet (500 mg total) by mouth every 8 (eight) hours as needed for muscle spasms. 08/30/23   Tammy Sor,  PA-C  Multiple Vitamin (MULTIVITAMIN) tablet Take 2 tablets by mouth in the morning.    [provider]  oxyCODONE  (OXY IR/ROXICODONE ) 5 MG immediate release tablet Take 0.5-1 tablets (2.5-5 mg total) by mouth every 4 (four) hours as needed for moderate pain (pain score 4-6) or severe pain (pain score 7-10) (2.5 mg for 4-6/10 pain. 5 mg for >6/10 pain). 08/30/23   Tammy Sor, PA-C  polyethylene glycol (MIRALAX  / GLYCOLAX ) 17 g packet Take 17 g  by mouth daily as needed for mild constipation or moderate constipation.    [provider]                                                                                                                                    Past Surgical History Past Surgical History:  Procedure Laterality Date   BELPHAROPTOSIS REPAIR Bilateral    COLONOSCOPY  Oc 2005   normal colonoscopy   COLONOSCOPY N/A 07/25/2016   Procedure: COLONOSCOPY;  Surgeon: Lamar CHRISTELLA Hollingshead, MD;  Location: [facility];  Service: Endoscopy;  Laterality: N/A;  9:15 AM   DEBRIDEMENT OF ABDOMINAL WALL ABSCESS N/A 08/27/2023   Procedure: DEBRIDEMENT OF INTRABDOMINAL ABSCESS;  Surgeon: Tanda Locus, MD;  Location: [facility] OR;  Service: General;  Laterality: N/A;   LAPAROSCOPIC APPENDECTOMY N/A 08/27/2023   Procedure: APPENDECTOMY LAPAROSCOPIC;  Surgeon: Tanda Locus, MD;  Location: [facility] OR;  Service: General;  Laterality: N/A;   pH and manometry   04/28/2002   RMR: Nonspecific motility disorder, DeMeester score 5.3, adequate control of reflux   throat muscle removed     Family History Family History  Problem Relation Age of Onset   Hypothyroidism Mother    Varicose Veins Mother    Heart disease Mother    Muscular dystrophy Father    Obesity Sister    Muscular dystrophy Brother    Obesity Brother    Obesity Brother    Colon cancer Neg Hx     Social History Social History   Tobacco Use   Smoking status: Former   Smokeless tobacco: Never   Tobacco comments:    quit 1979  Substance Use Topics   Alcohol use: No   Drug use: No   Allergies Advil [ibuprofen]  Review of Systems Review of Systems  All other systems reviewed and are negative.   Physical Exam Vital Signs  I have reviewed the triage vital signs BP 121/70 (BP Location: Left Arm)   Pulse (!) 56   Temp (!) 96.4 F (35.8 C) (Axillary)   Resp 16   Ht 5' 1 (1.549 m)   Wt 53.8 kg   SpO2 98%   BMI 22.41 kg/m  Physical Exam Vitals and nursing note  reviewed.  Constitutional:      General: She is not in acute distress.    Appearance: She is well-developed.  HENT:     Head: Normocephalic and atraumatic.  Mouth/Throat:     Mouth: Mucous membranes are moist.  Eyes:     Pupils: Pupils are equal, round, and reactive to light.  Cardiovascular:     Rate and Rhythm: Normal rate and regular rhythm.     Heart sounds: No murmur heard. Pulmonary:     Effort: Pulmonary effort is normal. No respiratory distress.     Breath sounds: Normal breath sounds.  Abdominal:     General: Abdomen is flat.     Palpations: Abdomen is soft.     Tenderness: There is no abdominal tenderness.  Musculoskeletal:        General: No tenderness.     Right lower leg: No edema.     Left lower leg: No edema.     Comments: Chronic appearing deformity right elbow  Skin:    General: Skin is warm and dry.  Neurological:     Mental Status: She is alert.     Comments: CN 2-12 grossly intact, intermittently following commands in all four extremities. Oriented to self  Psychiatric:        Mood and Affect: Mood normal.        Behavior: Behavior normal.     ED Results and Treatments Labs (all labs ordered are listed, but only abnormal results are displayed) Labs Reviewed  COMPREHENSIVE METABOLIC PANEL WITH GFR - Abnormal; Notable for the following components:      Result Value   Total Protein 6.4 (*)    AST 58 (*)    ALT 48 (*)    All other components within normal limits  CBC WITH DIFFERENTIAL/PLATELET - Abnormal; Notable for the following components:   RBC 3.86 (*)    Hemoglobin 11.9 (*)    HCT 35.0 (*)    All other components within normal limits  URINALYSIS, W/ REFLEX TO CULTURE (INFECTION SUSPECTED) - Abnormal; Notable for the following components:   Protein, ur 30 (*)    All other components within normal limits  TSH - Abnormal; Notable for the following components:   TSH 6.433 (*)    All other components within normal limits  RAPID URINE DRUG  SCREEN, HOSP PERFORMED - Abnormal; Notable for the following components:   Tetrahydrocannabinol POSITIVE (*)    All other components within normal limits  I-STAT CHEM 8, ED - Abnormal; Notable for the following components:   Hemoglobin 11.6 (*)    HCT 34.0 (*)    All other components within normal limits  AMMONIA  ETHANOL  CBG MONITORING, ED  TYPE AND SCREEN                                                                                                                          Radiology CT Head Wo Contrast Result Date: 02/09/2024 CLINICAL DATA:  Mental status change, unknown cause AMS. Pt reportedly was helping her husband mail packages approximately 2 hours ago and was seen well. She then began having increasing agitation, confusion. Pt reportedly had balance  issues and one episode of emesis with EMS. EXAM: CT HEAD WITHOUT CONTRAST TECHNIQUE: Contiguous axial images were obtained from the base of the skull through the vertex without intravenous contrast. RADIATION DOSE REDUCTION: This exam was performed according to the departmental dose-optimization program which includes automated exposure control, adjustment of the mA and/or kV according to patient size and/or use of iterative reconstruction technique. COMPARISON:  MRI head 01/03/2016 FINDINGS: Brain: No evidence of large-territorial acute infarction. No parenchymal hemorrhage. No mass lesion. No extra-axial collection. No mass effect or midline shift. No hydrocephalus. Basilar cisterns are patent. Vascular: No hyperdense vessel. Skull: No acute fracture or focal lesion. Sinuses/Orbits: Paranasal sinuses and mastoid air cells are clear. The orbits are unremarkable. Other: None. IMPRESSION: No acute intracranial abnormality. Electronically Signed   By: Morgane  Naveau M.D.   On: 02/09/2024 18:46   DG Chest Portable 1 View Result Date: 02/09/2024 CLINICAL DATA:  Confusion EXAM: PORTABLE CHEST 1 VIEW COMPARISON:  None Available. FINDINGS: Normal  mediastinum and cardiac silhouette. Normal pulmonary vasculature. No evidence of effusion, infiltrate, or pneumothorax. No acute bony abnormality. IMPRESSION: No acute cardiopulmonary process. Electronically Signed   By: Jackquline Boxer M.D.   On: 02/09/2024 18:12    Pertinent labs & imaging results that were available during my care of the patient were reviewed by me and considered in my medical decision making (see MDM for details).  Medications Ordered in ED Medications  ondansetron  (ZOFRAN ) injection 4 mg (4 mg Intravenous Given 02/09/24 1845)  sodium chloride  0.9 % bolus 1,000 mL (0 mLs Intravenous Stopped 02/09/24 1940)                                                                                                                                     Procedures Procedures  (including critical care time)  Medical Decision Making / ED Course   MDM:  73 year old presenting with altered mental status.  Patient does seem confused.  Moving all 4 extremities and intermittently following commands, oriented to self.  No other focal abnormality on exam.  Will obtain further workup.  Differential includes intracranial process, obtain CT head.  Differential also includes toxic or metabolic abnormality such as dehydration, will check labs, ammonia, TSH.  Differential also includes occult infection, will check urinalysis, chest x-ray.  Will reassess.  Labs and reports that patient has had similar episodes previously that resolved with rest, could be related to muscular dystrophy  Clinical Course as of 02/09/24 2254  Sun Feb 09, 2024  1912 Tetrahydrocannabinol(!): POSITIVE Patient does not recall using any THC products. Seems more alert. Will continue to monitor [WS]  2241 Pt still confused and having trouble with ambulation. Signed out to Dr. Arrien for admission.  [WS]    Clinical Course User Index [WS] Francesca Elsie CROME, MD     Additional history obtained: -Additional history obtained  from family and ems -External records from outside source obtained and  reviewed including: Chart review including previous notes, labs, imaging, consultation notes including prior notes    Lab Tests: -I ordered, reviewed, and interpreted labs.   The pertinent results include:   Labs Reviewed  COMPREHENSIVE METABOLIC PANEL WITH GFR - Abnormal; Notable for the following components:      Result Value   Total Protein 6.4 (*)    AST 58 (*)    ALT 48 (*)    All other components within normal limits  CBC WITH DIFFERENTIAL/PLATELET - Abnormal; Notable for the following components:   RBC 3.86 (*)    Hemoglobin 11.9 (*)    HCT 35.0 (*)    All other components within normal limits  URINALYSIS, W/ REFLEX TO CULTURE (INFECTION SUSPECTED) - Abnormal; Notable for the following components:   Protein, ur 30 (*)    All other components within normal limits  TSH - Abnormal; Notable for the following components:   TSH 6.433 (*)    All other components within normal limits  RAPID URINE DRUG SCREEN, HOSP PERFORMED - Abnormal; Notable for the following components:   Tetrahydrocannabinol POSITIVE (*)    All other components within normal limits  I-STAT CHEM 8, ED - Abnormal; Notable for the following components:   Hemoglobin 11.6 (*)    HCT 34.0 (*)    All other components within normal limits  AMMONIA  ETHANOL  CBG MONITORING, ED  TYPE AND SCREEN    Notable for + UDS   EKG   EKG Interpretation Date/Time:  Sunday February 09 2024 17:33:54 EDT Ventricular Rate:  59 PR Interval:  149 QRS Duration:  113 QT Interval:  438 QTC Calculation: 434 R Axis:   50  Text Interpretation: Sinus rhythm Borderline intraventricular conduction delay Low voltage, precordial leads Abnormal R-wave progression, early transition Confirmed by Francesca Fallow ([id number]) on 02/09/2024 7:44:54 PM         Imaging Studies ordered: I ordered imaging studies including CT head ,CXR On my interpretation imaging demonstrates  no acute process I independently visualized and interpreted imaging. I agree with the radiologist interpretation   Medicines ordered and prescription drug management: Meds ordered this encounter  Medications   ondansetron  (ZOFRAN ) injection 4 mg   sodium chloride  0.9 % bolus 1,000 mL    -I have reviewed the patients home medicines and have made adjustments as needed   Consultations Obtained: I requested consultation with the hospitalist,  and discussed lab and imaging findings as well as pertinent plan - they recommend: admission   Cardiac Monitoring: The patient was maintained on a cardiac monitor.  I personally viewed and interpreted the cardiac monitored which showed an underlying rhythm of: NSR   Reevaluation: After the interventions noted above, I reevaluated the patient and found that their symptoms have improved  Co morbidities that complicate the patient evaluation  Past Medical History:  Diagnosis Date   Broken arm    right   GERD (gastroesophageal reflux disease)    Hyperlipidemia    Hypothyroidism    Muscular dystrophy (HCC)    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HCC)       Dispostion: Disposition decision including need for hospitalization was considered, and patient admitted to the hospital.    Final Clinical Impression(s) / ED Diagnoses Final diagnoses:  Acute metabolic encephalopathy  Positive urine drug screen     This chart was dictated using voice recognition software.  Despite best efforts to proofread,  errors can occur which can change the documentation meaning.    Francesca Fallow  L, MD 02/09/24 2255

## 2024-02-09 NOTE — H&P (Signed)
 History and Physical    Patient: [name] FMW:[id number] DOB: 1951/03/28 DOA: 02/09/2024 DOS: the patient was seen and examined on 02/09/2024 PCP: Marvine Rush, MD  Patient coming from: Home  Chief Complaint:  Chief Complaint  Patient presents with   Altered Mental Status   HPI: [name] is a 73 y.o. female with medical history significant of GERD, hypothyroidism, hyperlipidemia, and muscular dystrophy who presented with altered mental status.  She has been at her usual state of health until the day of admission. During the afternoon hours she was noted confused and disorientated. Her speech was incoherent and did not know where she was. Noted very weak and fatigued. She had one episode of emesis.  No urinary symptoms, no diarrhea or fever. She has been eating and drinking well until this afternoon.  She had not similar symptoms in the past and she has been taking her medications as instructed. No recent change in her medical regimen.  Her husband though she was dehydrated and called EMS. She was found shouting and making rambling statements. Then patient was transported to the ED. She has received supportive medical therapy with some improvement in her symptoms but not back to baseline.   At the time of my examination patient is slow to respond to questions and follow commands. Most information obtained from her husband at the bedside.    Review of Systems: unable to review all systems due to the inability of the patient to answer questions. Past Medical History:  Diagnosis Date   Broken arm    right   GERD (gastroesophageal reflux disease)    Hyperlipidemia    Hypothyroidism    Muscular dystrophy (HCC)    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HCC)    Past Surgical History:  Procedure Laterality Date   BELPHAROPTOSIS REPAIR Bilateral    COLONOSCOPY  Oc 2005   normal colonoscopy   COLONOSCOPY N/A 07/25/2016   Procedure: COLONOSCOPY;  Surgeon: Lamar CHRISTELLA Hollingshead, MD;   Location: [facility];  Service: Endoscopy;  Laterality: N/A;  9:15 AM   DEBRIDEMENT OF ABDOMINAL WALL ABSCESS N/A 08/27/2023   Procedure: DEBRIDEMENT OF INTRABDOMINAL ABSCESS;  Surgeon: Tanda Locus, MD;  Location: [facility] OR;  Service: General;  Laterality: N/A;   LAPAROSCOPIC APPENDECTOMY N/A 08/27/2023   Procedure: APPENDECTOMY LAPAROSCOPIC;  Surgeon: Tanda Locus, MD;  Location: [facility] OR;  Service: General;  Laterality: N/A;   pH and manometry   04/28/2002   RMR: Nonspecific motility disorder, DeMeester score 5.3, adequate control of reflux   throat muscle removed     Social History:  reports that she has quit smoking. She has never used smokeless tobacco. She reports that she does not drink alcohol and does not use drugs.  Allergies  Allergen Reactions   Advil [Ibuprofen] Anaphylaxis and Swelling    Throat, lip swelling    Family History  Problem Relation Age of Onset   Hypothyroidism Mother    Varicose Veins Mother    Heart disease Mother    Muscular dystrophy Father    Obesity Sister    Muscular dystrophy Brother    Obesity Brother    Obesity Brother    Colon cancer Neg Hx     Prior to Admission medications   Medication Sig Start Date End Date Taking? Authorizing Provider  acetaminophen  (TYLENOL ) 500 MG tablet Take 2 tablets (1,000 mg total) by mouth every 6 (six) hours as needed. 08/30/23   Tammy Sor, PA-C  alendronate (FOSAMAX) 70 MG tablet Take 70  mg by mouth once a week. On Wednesday 12/22/20   [provider]  aluminum-magnesium hydroxide 200-200 MG/5ML suspension Take 5 mLs by mouth every 6 (six) hours as needed for indigestion.    [provider]  atorvastatin  (LIPITOR) 20 MG tablet Take 20 mg by mouth in the morning. 06/09/12   [provider]  bismuth subsalicylate (PEPTO BISMOL) 262 MG/15ML suspension Take 30 mLs by mouth every 6 (six) hours as needed.    [provider]  calcium  carbonate (TUMS - DOSED IN MG ELEMENTAL CALCIUM ) 500  MG chewable tablet Chew 2 tablets by mouth daily as needed for indigestion or heartburn.    [provider]  CALCIUM  PO Take 2 tablets by mouth in the morning.    [provider]  cholecalciferol (VITAMIN D3) 25 MCG (1000 UNIT) tablet Take 2,000 Units by mouth in the morning.    [provider]  escitalopram  (LEXAPRO ) 20 MG tablet Take 20 mg by mouth in the morning. 06/09/12   [provider]  esomeprazole (NEXIUM) 20 MG capsule Take 20 mg by mouth in the morning.    [provider]  fluticasone  (FLONASE ) 50 MCG/ACT nasal spray Place 1 spray into both nostrils daily as needed for allergies or rhinitis.    [provider]  levothyroxine  (SYNTHROID , LEVOTHROID) 100 MCG tablet Take 100 mcg by mouth daily before breakfast.    [provider]  methocarbamol  (ROBAXIN ) 500 MG tablet Take 1 tablet (500 mg total) by mouth every 8 (eight) hours as needed for muscle spasms. 08/30/23   Tammy Sor, PA-C  Multiple Vitamin (MULTIVITAMIN) tablet Take 2 tablets by mouth in the morning.    [provider]  oxyCODONE  (OXY IR/ROXICODONE ) 5 MG immediate release tablet Take 0.5-1 tablets (2.5-5 mg total) by mouth every 4 (four) hours as needed for moderate pain (pain score 4-6) or severe pain (pain score 7-10) (2.5 mg for 4-6/10 pain. 5 mg for >6/10 pain). 08/30/23   Tammy Sor, PA-C  polyethylene glycol (MIRALAX  / GLYCOLAX ) 17 g packet Take 17 g by mouth daily as needed for mild constipation or moderate constipation.    [provider]    Physical Exam: Vitals:   02/09/24 2100 02/09/24 2200 02/09/24 2230 02/09/24 2329  BP: 124/71 137/73 121/70 (!) 150/78  Pulse: (!) 56 (!) 58 (!) 56 (!) 54  Resp: 11 12 16 20   Temp:    97.6 F (36.4 C)  TempSrc:    Oral  SpO2: 97% 96% 98% 97%  Weight:      Height:       BP (!) 150/78   Pulse (!) 54   Temp 97.6 F (36.4 C) (Oral)   Resp 20   Ht 5' 1 (1.549 m)   Wt 53.8 kg   SpO2 97%    BMI 22.41 kg/m   Neurology somnolent but easy to arouse, her strength is preserved on the left side and right lower extremity edema, right upper extremity limited due to old injury. She was able to respond simple questions and follow simple commands. She had organized thinking and appropriate attention, oriented to place, person and time.  ENT with mild pallor, oral mucosa dry, no icterus Cardiovascular with S1 and S2 present and regular with no gallops, rubs or murmurs, Respiratory with no rales or wheezing, no rhonchi  Abdomen with no distention  No lower extremity edema   Data Reviewed:   Na 138, K 3,8 Cl 102 bicarbonate 25 glucose 96 bun  21 cr 0,50  AST 58 ALT 48  Wbc 7,1 hgb 11,9 plt 269  TSH 6,433  Urine analysis SG 1,019, protein 30, negative leukocytes and negative hgb  Alcohol < 15  Toxicology positive for THC   CT head negative for acute changes.  Chest radiograph with no cardiomegaly, no infiltrates or effusions.   EKG 59 bpm, normal axis, normal intervals, sinus rhythm with no significant ST segment or T wave changes.    Assessment and Plan: * Acute metabolic encephalopathy Possible intoxication with THC, acute dehydration.   Clinically improving with supportive medical therapy Plan to continue with IV fluids, and as needed antiemetics  Hold on oxycodone  and methocarbamol  for now.  Follow up with PT and OT If persistent symptoms may need brain MRI for further work up.   Elevated liver enzymes, continue with IV fluids and follow trend in am.   HLD (hyperlipidemia) Continue with atorvastatin    Hypothyroidism Continue with levothyroxine .  Noted high TSH, will check free T4    GERD (gastroesophageal reflux disease) Continue proton pump inhibitor   Memory impairment of gradual onset Continue with escitalopram .  Add multivitamins and thiamine      Advance Care Planning:   Code Status: Full Code   Consults:    Family Communication: I spoke with  patient's husband at the bedside, we talked in detail about patient's condition, plan of care and prognosis and all questions were addressed.   Severity of Illness: The appropriate patient status for this patient is OBSERVATION. Observation status is judged to be reasonable and necessary in order to provide the required intensity of service to ensure the patient's safety. The patient's presenting symptoms, physical exam findings, and initial radiographic and laboratory data in the context of their medical condition is felt to place them at decreased risk for further clinical deterioration. Furthermore, it is anticipated that the patient will be medically stable for discharge from the hospital within 2 midnights of admission.   Author: Elidia Toribio Furnace, MD 02/09/2024 11:32 PM  For on call review [email or web address].

## 2024-02-09 NOTE — Assessment & Plan Note (Signed)
Continue proton pump inhibitor

## 2024-02-09 NOTE — ED Notes (Signed)
 This NT and husband attempted to stand pt up. Pt very wobbly upon getting up with assistance from both of us . Pt placed back in bed due to pt barely able to stand up even with assistance. Per husband, pt has muscular dystrophy and has balance issues and uses a cane to walk, but the wobbliness when pt stood up is worse than her normal.

## 2024-02-10 ENCOUNTER — Other Ambulatory Visit: Payer: Self-pay

## 2024-02-10 DIAGNOSIS — G9341 Metabolic encephalopathy: Secondary | ICD-10-CM | POA: Diagnosis not present

## 2024-02-10 DIAGNOSIS — R413 Other amnesia: Secondary | ICD-10-CM | POA: Diagnosis not present

## 2024-02-10 DIAGNOSIS — E7849 Other hyperlipidemia: Secondary | ICD-10-CM | POA: Diagnosis not present

## 2024-02-10 DIAGNOSIS — E039 Hypothyroidism, unspecified: Secondary | ICD-10-CM | POA: Diagnosis not present

## 2024-02-10 DIAGNOSIS — K219 Gastro-esophageal reflux disease without esophagitis: Secondary | ICD-10-CM | POA: Diagnosis not present

## 2024-02-10 DIAGNOSIS — R825 Elevated urine levels of drugs, medicaments and biological substances: Secondary | ICD-10-CM | POA: Diagnosis not present

## 2024-02-10 LAB — CBC
HCT: 36.2 % (ref 36.0–46.0)
Hemoglobin: 11.7 g/dL — ABNORMAL LOW (ref 12.0–15.0)
MCH: 29.4 pg (ref 26.0–34.0)
MCHC: 32.3 g/dL (ref 30.0–36.0)
MCV: 91 fL (ref 80.0–100.0)
Platelets: 254 K/uL (ref 150–400)
RBC: 3.98 MIL/uL (ref 3.87–5.11)
RDW: 13.1 % (ref 11.5–15.5)
WBC: 6.8 K/uL (ref 4.0–10.5)
nRBC: 0 % (ref 0.0–0.2)

## 2024-02-10 LAB — T4, FREE: Free T4: 0.95 ng/dL (ref 0.61–1.12)

## 2024-02-10 LAB — COMPREHENSIVE METABOLIC PANEL WITH GFR
ALT: 45 U/L — ABNORMAL HIGH (ref 0–44)
AST: 51 U/L — ABNORMAL HIGH (ref 15–41)
Albumin: 3.2 g/dL — ABNORMAL LOW (ref 3.5–5.0)
Alkaline Phosphatase: 57 U/L (ref 38–126)
Anion gap: 7 (ref 5–15)
BUN: 16 mg/dL (ref 8–23)
CO2: 26 mmol/L (ref 22–32)
Calcium: 9 mg/dL (ref 8.9–10.3)
Chloride: 105 mmol/L (ref 98–111)
Creatinine, Ser: 0.48 mg/dL (ref 0.44–1.00)
GFR, Estimated: >60 mL/min (ref >60)
Glucose, Bld: 150 mg/dL — ABNORMAL HIGH (ref 70–99)
Potassium: 3.6 mmol/L (ref 3.5–5.1)
Sodium: 138 mmol/L (ref 135–145)
Total Bilirubin: 0.3 mg/dL (ref 0.0–1.2)
Total Protein: 6.1 g/dL — ABNORMAL LOW (ref 6.5–8.1)

## 2024-02-10 MED ORDER — POLYETHYLENE GLYCOL 3350 17 G PO PACK: 17.0000 g | PACK | Freq: Every day | ORAL | 0 refills | Status: AC | PRN

## 2024-02-10 NOTE — Plan of Care (Signed)

## 2024-02-10 NOTE — Discharge Summary (Signed)
 Physician Discharge Summary   Patient: [name] MRN: [id number] DOB: 29-Nov-1950  Admit date:     02/09/2024  Discharge date: 02/10/24  Discharge Physician: Eric Nunnery   PCP: Marvine Rush, MD   Recommendations at discharge:  Repeat complete metabolic panel to follow electrolytes, LFTs and renal function Repeat thyroid  panel in 6-8 weeks   Discharge Diagnoses: Principal Problem:   Acute metabolic encephalopathy Active Problems:   HLD (hyperlipidemia)   Hypothyroidism   GERD (gastroesophageal reflux disease)   Memory impairment of gradual onset   Positive urine drug screen  Brief Hospital admission narrative: As per H&P written by Dr. Noralee on 02/09/2024 [name] is a 74 y.o. female with medical history significant of GERD, hypothyroidism, hyperlipidemia, and muscular dystrophy who presented with altered mental status.  She has been at her usual state of health until the day of admission. During the afternoon hours she was noted confused and disorientated. Her speech was incoherent and did not know where she was. Noted very weak and fatigued. She had one episode of emesis.  No urinary symptoms, no diarrhea or fever. She has been eating and drinking well until this afternoon.  She had not similar symptoms in the past and she has been taking her medications as instructed. No recent change in her medical regimen.  Her husband though she was dehydrated and called EMS. She was found shouting and making rambling statements. Then patient was transported to the ED. She has received supportive medical therapy with some improvement in her symptoms but not back to baseline.    At the time of my examination patient is slow to respond to questions and follow commands. Most information obtained from her husband at the bedside.     Assessment and Plan: * Acute metabolic encephalopathy -Possible intoxication with THC, and compounding of acute dehydration.  - No acute ischemic or  intracranial abnormalities found - Patient mentation back to baseline at discharge after fluid resuscitation and supportive care - Physical therapy found her stable to go home without any services - Outpatient follow-up with PCP in 1 week recommended - Patient advised to maintain adequate hydration and to be mindful with the use of chronic medications. - Patient expressed no using any recreational drugs; possible false positive versus not intended consumption (she expressed obesity and affect stable in [place] where she ate and drank things out of her normal routine).  Transaminitis - Most likely in the setting of mild dehydration - Levels essentially back to normal at discharge - Continue to follow trend intermittently.  HLD (hyperlipidemia) -Continue with atorvastatin    Hypothyroidism -Continue current dose of Synthroid  - Free T4 within normal limits - Repeat thyroid  panel in 6 weeks.   GERD (gastroesophageal reflux disease) -Continue PPI.  Memory impairment of gradual onset -Continue treatment with Lexapro  - No suicidal ideation hallucination.  Consultants: None Procedures performed: See below for x-ray reports. Disposition: Home Diet recommendation: Regular diet.  DISCHARGE MEDICATION: Allergies as of 02/10/2024       Reactions   Advil [ibuprofen] Anaphylaxis, Swelling   Throat, lip swelling        Medication List     TAKE these medications    acetaminophen  500 MG tablet Commonly known as: TYLENOL  Take 2 tablets (1,000 mg total) by mouth every 6 (six) hours as needed. What changed:  how much to take reasons to take this   alendronate 70 MG tablet Commonly known as: FOSAMAX Take 70 mg by mouth once a week. On  Wednesday   aluminum-magnesium hydroxide 200-200 MG/5ML suspension Take 5 mLs by mouth every 6 (six) hours as needed for indigestion.   atorvastatin  20 MG tablet Commonly known as: LIPITOR Take 20 mg by mouth in the morning.   CALCIUM   PO Take 2 tablets by mouth in the morning.   cholecalciferol 25 MCG (1000 UNIT) tablet Commonly known as: VITAMIN D3 Take 2,000 Units by mouth in the morning.   escitalopram  20 MG tablet Commonly known as: LEXAPRO  Take 20 mg by mouth in the morning.   esomeprazole 20 MG capsule Commonly known as: NEXIUM Take 20 mg by mouth in the morning.   fluticasone  50 MCG/ACT nasal spray Commonly known as: FLONASE  Place 1 spray into both nostrils daily as needed for allergies or rhinitis.   levothyroxine  100 MCG tablet Commonly known as: SYNTHROID  Take 100 mcg by mouth daily before breakfast.   multivitamin tablet Take 2 tablets by mouth in the morning.   polyethylene glycol 17 g packet Commonly known as: MIRALAX  / GLYCOLAX  Take 17 g by mouth daily as needed for mild constipation or moderate constipation.        Follow-up Information     Marvine Rush, MD. Schedule an appointment as soon as possible for a visit in 1 week(s).   Specialty: Family Medicine Contact information: [address] [place] [address] [phone]                Discharge Exam: Fredricka Weights   02/09/24 2000  Weight: 53.8 kg   General exam: Alert, awake, oriented x 3 Respiratory system: Clear to auscultation. Respiratory effort normal. Cardiovascular system:RRR. No murmurs, rubs, gallops. Gastrointestinal system: Abdomen is nondistended, soft and nontender. No organomegaly or masses felt. Normal bowel sounds heard. Central nervous system: Alert and oriented. No focal neurological deficits. Extremities: No C/C/E, +pedal pulses Skin: No rashes, lesions or ulcers Psychiatry: Judgement and insight appear normal. Mood & affect appropriate.    Condition at discharge: Stable and improved.  The results of significant diagnostics from this hospitalization (including imaging, microbiology, ancillary and laboratory) are listed below for reference.   Imaging Studies: CT Head Wo  Contrast Result Date: 02/09/2024 CLINICAL DATA:  Mental status change, unknown cause AMS. Pt reportedly was helping her husband mail packages approximately 2 hours ago and was seen well. She then began having increasing agitation, confusion. Pt reportedly had balance issues and one episode of emesis with EMS. EXAM: CT HEAD WITHOUT CONTRAST TECHNIQUE: Contiguous axial images were obtained from the base of the skull through the vertex without intravenous contrast. RADIATION DOSE REDUCTION: This exam was performed according to the departmental dose-optimization program which includes automated exposure control, adjustment of the mA and/or kV according to patient size and/or use of iterative reconstruction technique. COMPARISON:  MRI head 01/03/2016 FINDINGS: Brain: No evidence of large-territorial acute infarction. No parenchymal hemorrhage. No mass lesion. No extra-axial collection. No mass effect or midline shift. No hydrocephalus. Basilar cisterns are patent. Vascular: No hyperdense vessel. Skull: No acute fracture or focal lesion. Sinuses/Orbits: Paranasal sinuses and mastoid air cells are clear. The orbits are unremarkable. Other: None. IMPRESSION: No acute intracranial abnormality. Electronically Signed   By: Morgane  Naveau M.D.   On: 02/09/2024 18:46   DG Chest Portable 1 View Result Date: 02/09/2024 CLINICAL DATA:  Confusion EXAM: PORTABLE CHEST 1 VIEW COMPARISON:  None Available. FINDINGS: Normal mediastinum and cardiac silhouette. Normal pulmonary vasculature. No evidence of effusion, infiltrate, or pneumothorax. No acute bony abnormality. IMPRESSION: No acute cardiopulmonary process.  Electronically Signed   By: Jackquline Boxer M.D.   On: 02/09/2024 18:12    Microbiology: Results for orders placed or performed during the hospital encounter of 08/21/23  Culture, blood (routine x 2)     Status: None   Collection Time: 08/21/23  6:53 PM   Specimen: BLOOD  Result Value Ref Range Status   Specimen  Description BLOOD SITE NOT SPECIFIED  Final   Special Requests   Final    BOTTLES DRAWN AEROBIC AND ANAEROBIC Blood Culture results may not be optimal due to an inadequate volume of blood received in culture bottles   Culture   Final    NO GROWTH 5 DAYS Performed at [facility], [address] [place], [place] [address]    Report Status 08/26/2023 FINAL  Final  Culture, blood (routine x 2)     Status: None   Collection Time: 08/21/23  6:58 PM   Specimen: BLOOD  Result Value Ref Range Status   Specimen Description BLOOD SITE NOT SPECIFIED  Final   Special Requests   Final    BOTTLES DRAWN AEROBIC AND ANAEROBIC Blood Culture results may not be optimal due to an inadequate volume of blood received in culture bottles   Culture   Final    NO GROWTH 5 DAYS Performed at [facility], [address] [place], [place] [address]    Report Status 08/26/2023 FINAL  Final  Surgical pcr screen     Status: None   Collection Time: 08/27/23  8:21 AM   Specimen: Nasal Mucosa; Nasal Swab  Result Value Ref Range Status   MRSA, PCR NEGATIVE NEGATIVE Final   Staphylococcus aureus NEGATIVE NEGATIVE Final    Comment: (NOTE) The Xpert SA Assay (FDA approved for NASAL specimens in patients 46 years of age and older), is one component of a comprehensive surveillance program. It is not intended to diagnose infection nor to guide or monitor treatment. Performed at [facility], [address], [place], [place] [address]     Labs: CBC: Recent Labs  Lab 02/09/24 1738 02/09/24 1752 02/10/24 0448  WBC 7.1  --  6.8  NEUTROABS 3.8  --   --   HGB 11.9* 11.6* 11.7*  HCT 35.0* 34.0* 36.2  MCV 90.7  --  91.0  PLT 269  --  254   Basic Metabolic Panel: Recent Labs  Lab 02/09/24 1738 02/09/24 1752 02/10/24 0448  NA 138 138 138  K 3.8 3.8 3.6  CL 102 102 105  CO2 25  --  26  GLUCOSE 96 95 150*  BUN 21 20 16   CREATININE 0.50 0.60 0.48  CALCIUM  9.5  --  9.0   Liver Function  Tests: Recent Labs  Lab 02/09/24 1738 02/10/24 0448  AST 58* 51*  ALT 48* 45*  ALKPHOS 58 57  BILITOT 0.4 0.3  PROT 6.4* 6.1*  ALBUMIN 3.6 3.2*   CBG: Recent Labs  Lab 02/09/24 1738  GLUCAP 94    Discharge time spent: greater than 30 minutes.  Signed: Eric Nunnery, MD [facility] 02/10/2024

## 2024-02-10 NOTE — Evaluation (Signed)
 Occupational Therapy Evaluation Patient Details Name: VICTORIAH WILDS MRN: [id number] DOB: 31-Dec-1950 Today's Date: 02/10/2024   History of Present Illness   [name] is a 73 y.o. female with medical history significant of GERD, hypothyroidism, hyperlipidemia, and muscular dystrophy who presented with altered mental status.   She has been at her usual state of health until the day of admission. During the afternoon hours she was noted confused and disorientated. Her speech was incoherent and did not know where she was. Noted very weak and fatigued. She had one episode of emesis.   No urinary symptoms, no diarrhea or fever. She has been eating and drinking well until this afternoon.   She had not similar symptoms in the past and she has been taking her medications as instructed. No recent change in her medical regimen.   Her husband though she was dehydrated and called EMS. She was found shouting and making rambling statements. Then patient was transported to the ED. She has received supportive medical therapy with some improvement in her symptoms but not back to baseline. (per MD)     Clinical Impressions Pt agreeable to OT and PT co-evaluation. Pt has 24/7 assist from spouse if needed. At this time pt requires CGA to min A for ambulation. Set up to CGA for ADL's due to unsteadiness in standing at times. B UE generally weak. Pt reports going to the gym prior to recent admission. Family reported feeling good about pt just continuing to go the gym rather than receive formal therapy. Pt left in the chair with call bell within reach and family present. Pt is not recommended for further acute OT services and will be discharged to care of nursing staff for remaining length of stay.      If plan is discharge home, recommend the following:   A little help with walking and/or transfers;A little help with bathing/dressing/bathroom;Assistance with cooking/housework;Assist for transportation;Help with  stairs or ramp for entrance     Functional Status Assessment   Patient has had a recent decline in their functional status and demonstrates the ability to make significant improvements in function in a reasonable and predictable amount of time.     Equipment Recommendations   None recommended by OT             Precautions/Restrictions   Precautions Precautions: Fall Recall of Precautions/Restrictions: Impaired Restrictions Weight Bearing Restrictions Per Provider Order: No     Mobility Bed Mobility Overal bed mobility: Modified Independent             General bed mobility comments: HOB elevated    Transfers Overall transfer level: Needs assistance Equipment used: Rolling walker (2 wheels) Transfers: Sit to/from Stand, Bed to chair/wheelchair/BSC Sit to Stand: Contact guard assist     Step pivot transfers: Contact guard assist, Min assist     General transfer comment: cuing for proper us  of RW at times; unsteady at times.      Balance Overall balance assessment: Needs assistance Sitting-balance support: No upper extremity supported, Feet supported Sitting balance-Leahy Scale: Fair Sitting balance - Comments: fair to good seated at EOB   Standing balance support: Bilateral upper extremity supported, During functional activity, Reliant on assistive device for balance Standing balance-Leahy Scale: Fair Standing balance comment: using RW                           ADL either performed or assessed with clinical judgement   ADL  Overall ADL's : Needs assistance/impaired     Grooming: Contact guard assist;Standing;Wash/dry face Grooming Details (indicate cue type and reason): Standing at the sink with RW available.     Lower Body Bathing: Set up;Contact guard assist;Sitting/lateral leans       Lower Body Dressing: Set up;Contact guard assist;Sitting/lateral leans Lower Body Dressing Details (indicate cue type and reason): Able to don  and doff sock seated at EOB without physical assist. Toilet Transfer: Contact guard assist;Minimal assistance;Rolling walker (2 wheels);Ambulation Toilet Transfer Details (indicate cue type and reason): EOB to toilet Toileting- Clothing Manipulation and Hygiene: Supervision/safety;Contact guard assist;Sit to/from stand;Sitting/lateral lean       Functional mobility during ADLs: Contact guard assist;Rolling walker (2 wheels);Minimal assistance General ADL Comments: Ambulated in the hall, bathroom, and room with CGA to min A using RW.     Vision Baseline Vision/History: 1 Wears glasses Ability to See in Adequate Light: 1 Impaired Patient Visual Report: No change from baseline Vision Assessment?: No apparent visual deficits     Perception Perception: Not tested       Praxis Praxis: Not tested       Pertinent Vitals/Pain Pain Assessment Pain Assessment: No/denies pain     Extremity/Trunk Assessment Upper Extremity Assessment Upper Extremity Assessment: Generalized weakness (R UE limited at baseline for elbow and shoulder range due to prior injury.)   Lower Extremity Assessment Lower Extremity Assessment: Defer to PT evaluation   Cervical / Trunk Assessment Cervical / Trunk Assessment: Kyphotic   Communication Communication Communication: Impaired Factors Affecting Communication: Reduced clarity of speech   Cognition Arousal: Alert Behavior During Therapy: WFL for tasks assessed/performed Cognition: Cognition impaired   Orientation impairments: Time         OT - Cognition Comments: Pt oriented to place and self, but not year.                 Following commands: Intact       Cueing  General Comments   Cueing Techniques: Verbal cues;Tactile cues  Pt reported a little dizziness to start the session.              Home Living Family/patient expects to be discharged to:: Private residence Living Arrangements: Spouse/significant other Available Help  at Discharge: Family;Available 24 hours/day Type of Home: House Home Access: Level entry     Home Layout: One level     Bathroom Shower/Tub: Producer, television/film/video: Handicapped height     Home Equipment: Agricultural consultant (2 wheels);Cane - single point;Hand held shower head;Grab bars - tub/shower   Additional Comments: Pt and husband report same living history as prior admission.      Prior Functioning/Environment Prior Level of Function : Needs assist       Physical Assist : ADLs (physical)   ADLs (physical): IADLs Mobility Comments: SPC; leans on furniture, and sometimes uses w/c on bad days. ADLs Comments: Independent ADL's; PRN assist IADL's from spouse.                            Co-evaluation PT/OT/SLP Co-Evaluation/Treatment: Yes Reason for Co-Treatment: To address functional/ADL transfers   OT goals addressed during session: ADL's and self-care      AM-PAC OT 6 Clicks Daily Activity     Outcome Measure Help from another person eating meals?: None Help from another person taking care of personal grooming?: A Little Help from another person toileting, which includes using toliet, bedpan, or urinal?:  A Little Help from another person bathing (including washing, rinsing, drying)?: A Little Help from another person to put on and taking off regular upper body clothing?: A Little Help from another person to put on and taking off regular lower body clothing?: A Little 6 Click Score: 19   End of Session Equipment Utilized During Treatment: Rolling walker (2 wheels)  Activity Tolerance: Patient tolerated treatment well Patient left: in chair;with call bell/phone within reach;with family/visitor present  OT Visit Diagnosis: Unsteadiness on feet (R26.81);Other abnormalities of gait and mobility (R26.89);Other symptoms and signs involving cognitive function                Time: 9159-9140 OT Time Calculation (min): 19 min Charges:  OT General  Charges $OT Visit: 1 Visit OT Evaluation $OT Eval Low Complexity: 1 Low  Jasmane Brockway OT, MOT   Jayson Person 02/10/2024, 9:31 AM

## 2024-02-10 NOTE — Progress Notes (Signed)
 Transition of Care Department [facility]) has reviewed patient and no other TOC needs have been identified at this time. We will continue to monitor patient advancement through interdisciplinary progression rounds. If new patient transition needs arise, please place a TOC consult.   02/10/24 1010  TOC Brief Assessment  Insurance and Status Reviewed  Patient has primary care physician Yes  Home environment has been reviewed Lives with husband.  Prior level of function: Independent.  Prior/Current Home Services No current home services  Social Drivers of Health Review SDOH reviewed no interventions necessary  Readmission risk has been reviewed Yes  Transition of care needs no transition of care needs at this time

## 2024-02-10 NOTE — Evaluation (Signed)
 Physical Therapy Evaluation Patient Details Name: [name] MRN: [id number] DOB: [birth year] Today's Date: 02/10/2024  History of Present Illness  [name] is a 73 y.o. female with medical history significant of GERD, hypothyroidism, hyperlipidemia, and muscular dystrophy who presented with altered mental status.   She has been at her usual state of health until the day of admission. During the afternoon hours she was noted confused and disorientated. Her speech was incoherent and did not know where she was. Noted very weak and fatigued. She had one episode of emesis.   No urinary symptoms, no diarrhea or fever. She has been eating and drinking well until this afternoon.   She had not similar symptoms in the past and she has been taking her medications as instructed. No recent change in her medical regimen.   Her husband though she was dehydrated and called EMS. She was found shouting and making rambling statements. Then patient was transported to the ED. She has received supportive medical therapy with some improvement in her symptoms but not back to baseline.   Clinical Impression  Patient functioning near baseline for functional mobility and gait demonstrating fair/good return for bed mobility, transfers and ambulating in room/hallway using RW without loss of balance. Patient tolerated sitting up in chair after therapy with spouse present. PLAN:  Patient to be discharged home today and discharged from acute physical therapy to care of nursing for ambulation as tolerated for length of stay with recommendations stated below           If plan is discharge home, recommend the following: Help with stairs or ramp for entrance;A little help with bathing/dressing/bathroom;A little help with walking and/or transfers;Assistance with cooking/housework   Can travel by private vehicle        Equipment Recommendations None recommended by PT  Recommendations for Other Services        Functional Status Assessment Patient has had a recent decline in their functional status and/or demonstrates limited ability to make significant improvements in function in a reasonable and predictable amount of time     Precautions / Restrictions Precautions Precautions: Fall Recall of Precautions/Restrictions: Impaired Restrictions Weight Bearing Restrictions Per Provider Order: No      Mobility  Bed Mobility Overal bed mobility: Modified Independent             General bed mobility comments: HOB elevated    Transfers Overall transfer level: Needs assistance Equipment used: Rolling walker (2 wheels) Transfers: Sit to/from Stand, Bed to chair/wheelchair/BSC Sit to Stand: Contact guard assist   Step pivot transfers: Contact guard assist, Min assist       General transfer comment: slightly labored movement with good return for transferring to/from commode in bathroom    Ambulation/Gait Ambulation/Gait assistance: Supervision, Contact guard assist Gait Distance (Feet): 100 Feet Assistive device: Rolling walker (2 wheels) Gait Pattern/deviations: Decreased step length - right, Decreased step length - left, Decreased stride length Gait velocity: decreased     General Gait Details: slightly labored movement wihtout loss of balance and good return demonstrated for using RW  Stairs            Wheelchair Mobility     Tilt Bed    Modified Rankin (Stroke Patients Only)       Balance Overall balance assessment: Needs assistance Sitting-balance support: Feet supported, No upper extremity supported Sitting balance-Leahy Scale: Fair Sitting balance - Comments: fair/good seated at EOB   Standing balance support: During functional activity, No upper extremity  supported Standing balance-Leahy Scale: Poor Standing balance comment: fair using RW                             Pertinent Vitals/Pain Pain Assessment Pain Assessment: No/denies pain     Home Living Family/patient expects to be discharged to:: Private residence Living Arrangements: Spouse/significant other Available Help at Discharge: Family;Available 24 hours/day Type of Home: House Home Access: Level entry       Home Layout: One level Home Equipment: Agricultural consultant (2 wheels);Cane - single point;Hand held shower head;Grab bars - tub/shower Additional Comments: Pt and husband report same living history as prior admission.    Prior Function Prior Level of Function : Needs assist       Physical Assist : ADLs (physical);Mobility (physical) Mobility (physical): Bed mobility;Transfers;Gait;Stairs ADLs (physical): IADLs Mobility Comments: SPC; leans on furniture, and sometimes uses w/c on bad days. ADLs Comments: Independent ADL's; PRN assist IADL's from spouse.     Extremity/Trunk Assessment   Upper Extremity Assessment Upper Extremity Assessment: Defer to OT evaluation    Lower Extremity Assessment Lower Extremity Assessment: Generalized weakness    Cervical / Trunk Assessment Cervical / Trunk Assessment: Kyphotic  Communication   Communication Communication: Impaired Factors Affecting Communication: Reduced clarity of speech    Cognition Arousal: Alert Behavior During Therapy: WFL for tasks assessed/performed   PT - Cognitive impairments: No apparent impairments                         Following commands: Intact       Cueing Cueing Techniques: Verbal cues, Tactile cues     General Comments General comments (skin integrity, edema, etc.): Pt reported a little dizziness to start the session.    Exercises     Assessment/Plan    PT Assessment Patient does not need any further PT services  PT Problem List         PT Treatment Interventions      PT Goals (Current goals can be found in the Care Plan section)  Acute Rehab PT Goals Patient Stated Goal: return home with family to assist PT Goal Formulation: With  patient/family Time For Goal Achievement: 02/10/24 Potential to Achieve Goals: Good    Frequency       Co-evaluation PT/OT/SLP Co-Evaluation/Treatment: Yes Reason for Co-Treatment: To address functional/ADL transfers PT goals addressed during session: Mobility/safety with mobility;Balance;Proper use of DME OT goals addressed during session: ADL's and self-care       AM-PAC PT 6 Clicks Mobility  Outcome Measure Help needed turning from your back to your side while in a flat bed without using bedrails?: None Help needed moving from lying on your back to sitting on the side of a flat bed without using bedrails?: A Little Help needed moving to and from a bed to a chair (including a wheelchair)?: A Little Help needed standing up from a chair using your arms (e.g., wheelchair or bedside chair)?: A Little Help needed to walk in hospital room?: A Little Help needed climbing 3-5 steps with a railing? : A Lot 6 Click Score: 18    End of Session   Activity Tolerance: Patient tolerated treatment well;Patient limited by fatigue Patient left: in chair;with call bell/phone within reach;with family/visitor present;with chair alarm set Nurse Communication: Mobility status PT Visit Diagnosis: Unsteadiness on feet (R26.81);Other abnormalities of gait and mobility (R26.89);Muscle weakness (generalized) (M62.81)    Time: 9161-9141 PT Time  Calculation (min) (ACUTE ONLY): 20 min   Charges:   PT Evaluation $PT Eval Moderate Complexity: 1 Mod PT Treatments $Therapeutic Activity: 8-22 mins PT General Charges $$ ACUTE PT VISIT: 1 Visit         12:30 PM, 02/10/24 Lynwood Music, MPT Physical Therapist with [facility] [phone] office [phone] mobile phone

## 2024-02-10 NOTE — Progress Notes (Signed)
 Patient discharged home with husband at the bedside. Discharge instructions provided. Verbalizes understanding.

## 2024-03-25 DIAGNOSIS — F329 Major depressive disorder, single episode, unspecified: Secondary | ICD-10-CM | POA: Diagnosis not present

## 2024-03-25 DIAGNOSIS — E782 Mixed hyperlipidemia: Secondary | ICD-10-CM | POA: Diagnosis not present

## 2024-03-25 DIAGNOSIS — K219 Gastro-esophageal reflux disease without esophagitis: Secondary | ICD-10-CM | POA: Diagnosis not present

## 2024-03-25 DIAGNOSIS — Z6820 Body mass index (BMI) 20.0-20.9, adult: Secondary | ICD-10-CM | POA: Diagnosis not present

## 2024-03-25 DIAGNOSIS — Z0001 Encounter for general adult medical examination with abnormal findings: Secondary | ICD-10-CM | POA: Diagnosis not present

## 2024-03-25 DIAGNOSIS — E559 Vitamin D deficiency, unspecified: Secondary | ICD-10-CM | POA: Diagnosis not present

## 2024-03-25 DIAGNOSIS — E039 Hypothyroidism, unspecified: Secondary | ICD-10-CM | POA: Diagnosis not present

## 2024-03-25 DIAGNOSIS — E7849 Other hyperlipidemia: Secondary | ICD-10-CM | POA: Diagnosis not present

## 2024-03-25 DIAGNOSIS — Z1331 Encounter for screening for depression: Secondary | ICD-10-CM | POA: Diagnosis not present

## 2024-05-25 DIAGNOSIS — R1313 Dysphagia, pharyngeal phase: Secondary | ICD-10-CM | POA: Diagnosis not present

## 2024-05-25 DIAGNOSIS — R1312 Dysphagia, oropharyngeal phase: Secondary | ICD-10-CM | POA: Diagnosis not present

## 2024-05-25 DIAGNOSIS — G7109 Other specified muscular dystrophies: Secondary | ICD-10-CM | POA: Diagnosis not present

## 2024-07-06 DIAGNOSIS — R32 Unspecified urinary incontinence: Secondary | ICD-10-CM | POA: Diagnosis not present

## 2024-07-06 DIAGNOSIS — M2010 Hallux valgus (acquired), unspecified foot: Secondary | ICD-10-CM | POA: Diagnosis not present

## 2024-07-06 DIAGNOSIS — F324 Major depressive disorder, single episode, in partial remission: Secondary | ICD-10-CM | POA: Diagnosis not present

## 2024-07-06 DIAGNOSIS — M2040 Other hammer toe(s) (acquired), unspecified foot: Secondary | ICD-10-CM | POA: Diagnosis not present

## 2024-07-06 DIAGNOSIS — Z79890 Hormone replacement therapy (postmenopausal): Secondary | ICD-10-CM | POA: Diagnosis not present

## 2024-07-06 DIAGNOSIS — E039 Hypothyroidism, unspecified: Secondary | ICD-10-CM | POA: Diagnosis not present

## 2024-07-06 DIAGNOSIS — M810 Age-related osteoporosis without current pathological fracture: Secondary | ICD-10-CM | POA: Diagnosis not present

## 2024-07-06 DIAGNOSIS — I700 Atherosclerosis of aorta: Secondary | ICD-10-CM | POA: Diagnosis not present

## 2024-07-06 DIAGNOSIS — Z8249 Family history of ischemic heart disease and other diseases of the circulatory system: Secondary | ICD-10-CM | POA: Diagnosis not present

## 2024-07-06 DIAGNOSIS — G7100 Muscular dystrophy, unspecified: Secondary | ICD-10-CM | POA: Diagnosis not present

## 2024-07-06 DIAGNOSIS — K219 Gastro-esophageal reflux disease without esophagitis: Secondary | ICD-10-CM | POA: Diagnosis not present

## 2024-07-06 DIAGNOSIS — Z7983 Long term (current) use of bisphosphonates: Secondary | ICD-10-CM | POA: Diagnosis not present

## 2024-07-06 DIAGNOSIS — Z9989 Dependence on other enabling machines and devices: Secondary | ICD-10-CM | POA: Diagnosis not present

## 2024-07-06 DIAGNOSIS — Z886 Allergy status to analgesic agent status: Secondary | ICD-10-CM | POA: Diagnosis not present

## 2024-07-06 DIAGNOSIS — E785 Hyperlipidemia, unspecified: Secondary | ICD-10-CM | POA: Diagnosis not present

## 2024-07-06 DIAGNOSIS — M2020 Hallux rigidus, unspecified foot: Secondary | ICD-10-CM | POA: Diagnosis not present

## 2024-07-06 DIAGNOSIS — M1990 Unspecified osteoarthritis, unspecified site: Secondary | ICD-10-CM | POA: Diagnosis not present

## 2024-07-06 DIAGNOSIS — G9340 Encephalopathy, unspecified: Secondary | ICD-10-CM | POA: Diagnosis not present

## 2024-09-17 ENCOUNTER — Ambulatory Visit: Payer: Self-pay | Admitting: Family Medicine

## 2024-09-21 ENCOUNTER — Ambulatory Visit: Admitting: Student in an Organized Health Care Education/Training Program
